# Patient Record
Sex: Male | Born: 1975
Health system: Southern US, Community
[De-identification: ages and names within clinical notes are randomized; demographics above are authoritative.]

## PROBLEM LIST (undated history)

## (undated) DIAGNOSIS — K2970 Gastritis, unspecified, without bleeding: Secondary | ICD-10-CM

## (undated) DIAGNOSIS — K429 Umbilical hernia without obstruction or gangrene: Secondary | ICD-10-CM

## (undated) DIAGNOSIS — F419 Anxiety disorder, unspecified: Secondary | ICD-10-CM

## (undated) DIAGNOSIS — K219 Gastro-esophageal reflux disease without esophagitis: Secondary | ICD-10-CM

## (undated) HISTORY — DX: Gastritis, unspecified, without bleeding: K29.70

## (undated) HISTORY — DX: Anxiety disorder, unspecified: F41.9

## (undated) HISTORY — DX: Umbilical hernia without obstruction or gangrene: K42.9

## (undated) HISTORY — DX: Gastro-esophageal reflux disease without esophagitis: K21.9

---

## 2007-06-17 ENCOUNTER — Encounter: Admission: RE | Admit: 2007-06-17 | Discharge: 2007-06-17 | Payer: Self-pay | Admitting: Family Medicine

## 2008-08-29 ENCOUNTER — Ambulatory Visit: Payer: Self-pay | Admitting: Sports Medicine

## 2008-08-29 DIAGNOSIS — M7650 Patellar tendinitis, unspecified knee: Secondary | ICD-10-CM

## 2008-08-29 DIAGNOSIS — M25569 Pain in unspecified knee: Secondary | ICD-10-CM

## 2009-06-08 ENCOUNTER — Encounter: Admission: RE | Admit: 2009-06-08 | Discharge: 2009-06-08 | Payer: Self-pay | Admitting: Family Medicine

## 2011-09-03 ENCOUNTER — Other Ambulatory Visit: Payer: Self-pay | Admitting: Family Medicine

## 2011-09-03 DIAGNOSIS — R109 Unspecified abdominal pain: Secondary | ICD-10-CM

## 2011-09-04 ENCOUNTER — Ambulatory Visit
Admission: RE | Admit: 2011-09-04 | Discharge: 2011-09-04 | Disposition: A | Discharge status: Home / Self Care | Payer: 59 | Source: Ambulatory Visit | Attending: Family Medicine | Admitting: Family Medicine

## 2011-09-04 DIAGNOSIS — R109 Unspecified abdominal pain: Secondary | ICD-10-CM

## 2013-07-03 ENCOUNTER — Encounter: Payer: Self-pay | Admitting: *Deleted

## 2015-07-28 DIAGNOSIS — M1610 Unilateral primary osteoarthritis, unspecified hip: Secondary | ICD-10-CM | POA: Insufficient documentation

## 2016-02-26 DIAGNOSIS — I517 Cardiomegaly: Secondary | ICD-10-CM | POA: Insufficient documentation

## 2016-03-27 HISTORY — PX: TOTAL HIP ARTHROPLASTY: SHX124

## 2016-04-09 DIAGNOSIS — Z96649 Presence of unspecified artificial hip joint: Secondary | ICD-10-CM | POA: Insufficient documentation

## 2016-04-30 DIAGNOSIS — M1611 Unilateral primary osteoarthritis, right hip: Secondary | ICD-10-CM | POA: Diagnosis not present

## 2016-04-30 DIAGNOSIS — R262 Difficulty in walking, not elsewhere classified: Secondary | ICD-10-CM | POA: Diagnosis not present

## 2016-04-30 DIAGNOSIS — M25551 Pain in right hip: Secondary | ICD-10-CM | POA: Diagnosis not present

## 2016-05-08 DIAGNOSIS — Z96641 Presence of right artificial hip joint: Secondary | ICD-10-CM | POA: Diagnosis not present

## 2016-05-08 DIAGNOSIS — M160 Bilateral primary osteoarthritis of hip: Secondary | ICD-10-CM | POA: Diagnosis not present

## 2016-10-22 DIAGNOSIS — R079 Chest pain, unspecified: Secondary | ICD-10-CM | POA: Diagnosis not present

## 2016-10-22 DIAGNOSIS — R1011 Right upper quadrant pain: Secondary | ICD-10-CM | POA: Diagnosis not present

## 2016-10-22 DIAGNOSIS — R160 Hepatomegaly, not elsewhere classified: Secondary | ICD-10-CM | POA: Diagnosis not present

## 2016-10-22 DIAGNOSIS — R1013 Epigastric pain: Secondary | ICD-10-CM | POA: Diagnosis not present

## 2016-10-23 ENCOUNTER — Encounter: Payer: Self-pay | Admitting: Nurse Practitioner

## 2016-10-27 HISTORY — PX: ESOPHAGOGASTRODUODENOSCOPY: SHX1529

## 2016-10-29 ENCOUNTER — Encounter: Payer: Self-pay | Admitting: Nurse Practitioner

## 2016-10-29 ENCOUNTER — Ambulatory Visit (INDEPENDENT_AMBULATORY_CARE_PROVIDER_SITE_OTHER): Payer: 59 | Admitting: Nurse Practitioner

## 2016-10-29 ENCOUNTER — Encounter (INDEPENDENT_AMBULATORY_CARE_PROVIDER_SITE_OTHER): Payer: Self-pay

## 2016-10-29 VITALS — BP 120/80 | HR 72 | Ht 74.0 in | Wt 216.4 lb

## 2016-10-29 DIAGNOSIS — R1011 Right upper quadrant pain: Secondary | ICD-10-CM

## 2016-10-29 NOTE — Patient Instructions (Addendum)
If you are age 39 or older, your body mass index should be between 23-30. Your Body mass index is 27.78 kg/m. If this is out of the aforementioned range listed, please consider follow up with your Primary Care Provider.  If you are age 24 or younger, your body mass index should be between 19-25. Your Body mass index is 27.78 kg/m. If this is out of the aformentioned range listed, please consider follow up with your Primary Care Provider.   You have been scheduled for an endoscopy. Please follow written instructions given to you at your visit today. If you use inhalers (even only as needed), please bring them with you on the day of your procedure. Your physician has requested that you go to www.startemmi.com and enter the access code given to you at your visit today. This web site gives a general overview about your procedure. However, you should still follow specific instructions given to you by our office regarding your preparation for the procedure.   Thank you for choosing me and [facility].   Tye Savoy, NP

## 2016-10-29 NOTE — Progress Notes (Addendum)
HPI:  Patient is a 41 yo male [place] with a PMH significant for osteoarthritis of hip, s/p replacement in November. He is new to the practice, here for evaluation of abdominal discomfort. Patient gives a year or so history of intermittent RUQ discomfort described as a burning sensation, non-radiating. Discomfort most often, but not always postprandial and worse with caffeine. Additionally, he doesn't consume much ETOH but has noticed a correlation between ETOH and the discomfort.  He was taking NSIADs on regular basis prior to hip surgery in November now rarely takes them. Concerned about gallbladder disease patient began taking apple cider months ago. He thinks the cider helped but it was hard to know for sure because his diet was poor and he was consuming massive amounts of caffeine.  He has no associated nausea/vomiting. No major bowel changes. No blood in stool. No major weight loss.  Over the last couple of months the discomfort has been more pronounced leading to anxiety. He is hesitant to call the RUQ discomfort pain but just "feels off" and uncomfortable after eating. He had an episode of the discomfort on the way to [facility] 6/26. Discomfort associated with flushing and feeling out of sorts so he went to ED in [facility]. In retrospect patient thinks the abdominal discomfort led to anxiety. Labs from ED reviewed.  WBC normal at 47, hemoglobin normal at 14.1, platelets low at 144 . Renal function was normal. Liver labs and lipase were normal. Urinalysis normal. Patient apparently had an ultrasound, there was some question about whether the gallbladder could be adequately evaluated but overall no gross abnormalities. Patient was prescribed Reglan, Carafate and 40 mg of omeprazole daily. He is not taking the Reglan, it made him feel jittery. Also jittery from Omeprazole so decreased dose to 20mg  and feels better. Since making dietary changes (omitting ETOH, soda, and caffeine) and taking meds  he is feeling better.      Past Medical History:  Diagnosis Date  . Umbilical hernia     Past Surgical History:  Procedure Laterality Date  . TOTAL HIP ARTHROPLASTY  03/27/2016   Family History  Problem Relation Age of Onset  . Colon cancer Paternal Grandfather    Social History  Substance Use Topics  . Smoking status: Never Smoker  . Smokeless tobacco: Never Used  . Alcohol use No   Current Outpatient Prescriptions  Medication Sig Dispense Refill  . Multiple Vitamin (MULTIVITAMIN) tablet Take 1 tablet by mouth daily.    . Omega-3 Fatty Acids (FISH OIL PO) Take by mouth.    Marland Kitchen omeprazole (PRILOSEC) 20 MG capsule Take 20 mg by mouth daily.    . sucralfate (CARAFATE) 1 g tablet Take 1 g by mouth 2 (two) times daily.     No current facility-administered medications for this visit.    No Known Allergies   Review of Systems:  Positive for allergy, sinus trouble, and back pain. All systems reviewed and negative except where noted in HPI.   Physical Exam: BP 120/80   Pulse 72   Ht 6\' 2"  (1.88 m)   Wt 216 lb 6 oz (98.1 kg)   BMI 27.78 kg/m  Constitutional:  Well-developed, white male in no acute distress. Psychiatric: Normal mood and affect. Behavior is normal. EENT: Pupils normal.  Conjunctivae are normal. No scleral icterus. Neck supple.  Cardiovascular: Normal rate, regular rhythm. No edema Pulmonary/chest: Effort normal and breath sounds normal. No wheezing, rales or rhonchi. Abdominal: Soft, nondistended. Nontender.  Bowel sounds active throughout. There are no masses palpable. No hepatomegaly. Lymphadenopathy: No cervical adenopathy noted. Neurological: Alert and oriented to person place and time. Skin: Skin is warm and dry. No rashes noted.   ASSESSMENT AND PLAN:  41 yo male with one year history of RUQ discomfort, progressive over last couple of months. Discomfort mainly postprandial but also associated with caffeine and ETOH intake. No significant ETOH use  but he has been consuming huge amount of caffeine until recently. Some improvement in discomfort with carafate, Prilosec and dietary changes. Recent labs at ED in [place] were unremarkable.  -We discussed options such as a watch and watch approach to see how he does from here vrs proceeding with upper endoscopy.  Patient and wife would like to proceed with EGD. The risks and benefits of EGD were discussed and the patient agrees to proceed.  -Requesting ultrasound report from [facility] in [place]. Patient seems to think the gallbladder wasn't visualized because of gas in bowel but I suspect it may have been the pancreas instead.  -Continue PPI. He thinks Carafate is helping and wants to continue that for now as well.  -further recommendations to follow EGD.     Tye Savoy, NP  10/29/2016, 1:22 PM  Agree with Ms. Lilian Fuhs's assessment and plan. Gatha Mayer, MD, [facility]

## 2016-11-01 ENCOUNTER — Encounter: Payer: Self-pay | Admitting: Family Medicine

## 2016-11-01 ENCOUNTER — Telehealth: Payer: Self-pay | Admitting: Internal Medicine

## 2016-11-01 ENCOUNTER — Ambulatory Visit (INDEPENDENT_AMBULATORY_CARE_PROVIDER_SITE_OTHER): Payer: 59 | Admitting: Family Medicine

## 2016-11-01 ENCOUNTER — Encounter: Payer: Self-pay | Admitting: Internal Medicine

## 2016-11-01 VITALS — BP 136/86 | HR 65 | Temp 98.2°F | Ht 74.0 in | Wt 214.6 lb

## 2016-11-01 DIAGNOSIS — J309 Allergic rhinitis, unspecified: Secondary | ICD-10-CM | POA: Diagnosis not present

## 2016-11-01 DIAGNOSIS — Z13220 Encounter for screening for lipoid disorders: Secondary | ICD-10-CM | POA: Diagnosis not present

## 2016-11-01 DIAGNOSIS — Z6827 Body mass index (BMI) 27.0-27.9, adult: Secondary | ICD-10-CM

## 2016-11-01 DIAGNOSIS — F419 Anxiety disorder, unspecified: Secondary | ICD-10-CM

## 2016-11-01 DIAGNOSIS — R5383 Other fatigue: Secondary | ICD-10-CM

## 2016-11-01 DIAGNOSIS — Z7689 Persons encountering health services in other specified circumstances: Secondary | ICD-10-CM | POA: Diagnosis not present

## 2016-11-01 MED ORDER — FLUTICASONE PROPIONATE 50 MCG/ACT NA SUSP: 2.0000 | Freq: Every day | NASAL | 6 refills | Status: AC

## 2016-11-01 NOTE — Patient Instructions (Signed)
Use Afrin (generic is fine) nasal spray 15 minutes before using Flonse Try to use flonase for at least 3-4 weeks

## 2016-11-01 NOTE — Telephone Encounter (Signed)
Patient informed that we did get it and the report is on Paula's desk for review and we will be back in touch.

## 2016-11-01 NOTE — Progress Notes (Signed)
Subjective:    Patient ID: Matthew Hansen, male    DOB: Sep 10, 1975, 41 y.o.   MRN: [id number]  HPI This is a 41 yo male who presents today to establish care. 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 He is accompanied by his wife. He also has his own landscaping business.  He was seen by GI three days ago with chronic RUQ discomfort (approximately 1 year). Had recently been seen in ED [place] way [place] [facility] with abdominal pain/anxiety. Was given reglan, omeprazole and carafate. Stopped reglan and reduced omeprazole due to increased anxiety. Has stopped carafate, thinks it made him feel full and decreased his appetite. Appetite improving with omeprazole. Stomach is feeling better, he has appointment for EGD later this month.  Fatigue-  Had been taking prohormones ("to suppress my natural testosterone"), stopped taking them about 2.5-3 weeks ago and feels off. Started taking them in November. Was supposed to take a different supplement when he stopped the prohormones to "naturally build up my testosterone," but didn't because of concerns over his stomach issues.   Anxiety/panic attacks- Has had increased anxiety, started after abdominal issues, for about 1 month. Feels up and down with emotions, feels fatigued. Sleeping ok. Episodes feel like "butterflies in my stomach and like I need to go to the bathroom." Lasts for about an hour, will go away with relaxing, talking to his wife. Increased stress lately with work. 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   Allergic rhinitis/sinus- Gets frequent sinus infections. Was recently on amoxicillin and prednisone. Has seasonal allergies in spring/summer. Has tried flonase in past, but did not use for very long. Doesn't tolerate decongestants, "make me feel funny."   Past Medical History:  Diagnosis Date  . Umbilical hernia    Past Surgical  History:  Procedure Laterality Date  . TOTAL HIP ARTHROPLASTY  03/27/2016   Family History  Problem Relation Age of Onset  . Colon cancer Paternal Grandfather    Social History  Substance Use Topics  . Smoking status: Never Smoker  . Smokeless tobacco: Never Used  . Alcohol use Yes     Comment: 2-3 per week      Review of Systems  Constitutional: Positive for fatigue. Negative for fever and unexpected weight change.  HENT: Positive for ear pain (left), sinus pressure and sore throat.   Eyes: Negative.   Respiratory: Negative.   Cardiovascular: Positive for palpitations (during anxiety). Negative for chest pain and leg swelling.  Gastrointestinal: Positive for abdominal pain. Negative for anal bleeding, blood in stool, constipation, diarrhea and nausea.  Endocrine: Negative.   Genitourinary: Negative for difficulty urinating, dysuria and flank pain.  Musculoskeletal: Negative.   Skin: Negative.   Allergic/Immunologic: Negative for environmental allergies.  Psychiatric/Behavioral: The patient is nervous/anxious.        Objective:   Physical Exam  Constitutional: He is oriented to person, place, and time. He appears well-developed and well-nourished. No distress.  HENT:  Head: Normocephalic and atraumatic.  Right Ear: Tympanic membrane, external ear and ear canal normal. No mastoid tenderness.  Left Ear: External ear and ear canal normal. No mastoid tenderness. Tympanic membrane is not injected, not erythematous, not retracted and not bulging.  Ears:  Nose: Nose normal.  Mouth/Throat: Oropharynx is clear and moist. No oropharyngeal exudate.  Eyes: Conjunctivae are normal.  Neck:  Normal range of motion. Neck supple.  Cardiovascular: Normal rate, regular rhythm and normal heart sounds.   Pulmonary/Chest: Effort normal and breath sounds normal.  Musculoskeletal: He exhibits no edema.  Lymphadenopathy:    He has no cervical adenopathy.  Neurological: He is alert and  oriented to person, place, and time.  Skin: Skin is warm and dry. He is not diaphoretic.  Psychiatric: He has a normal mood and affect. His behavior is normal. Judgment and thought content normal.  Vitals reviewed.     BP 136/86 (BP Location: Right Arm, Patient Position: Sitting, Cuff Size: Normal)   Pulse 65   Temp 98.2 F (36.8 C) (Oral)   Ht 6\' 2"  (1.88 m)   Wt 214 lb 9.6 oz (97.3 kg)   SpO2 96%   BMI 27.55 kg/m  Wt Readings from Last 3 Encounters:  11/01/16 214 lb 9.6 oz (97.3 kg)  10/29/16 216 lb 6 oz (98.1 kg)  08/29/08 (!) 225 lb (102.1 kg)   Depression screen PHQ 2/9 11/01/2016  Decreased Interest 2  Down, Depressed, Hopeless 2  PHQ - 2 Score 4  Altered sleeping 0  Tired, decreased energy 2  Change in appetite 1  Feeling bad or failure about yourself  0  Trouble concentrating 2  Moving slowly or fidgety/restless 0  Suicidal thoughts 0  PHQ-9 Score 9      Assessment & Plan:  1. Encounter to establish care - Discussed and encouraged healthy lifestyle choices- adequate sleep, regular exercise, stress management and healthy food choices.   2. Anxiety - seems to be exacerbated by work stressors and recent abdominal discomfort. He is keeping a log of episodes and I encouraged him to continue this to see if episode frequency and duration decrease as abdominal symptoms improve - he is getting through episodes with breathing and relaxation and with help from his wife - follow up if not significantly improved in next couple of weeks - not sure of role of recent prednisone use and prohormone usage  3. Allergic rhinitis, unspecified seasonality, unspecified trigger - Afrin before flonase for 4 days max - discussed flonase use and encouraged him to use for several weeks - fluticasone (FLONASE) 50 MCG/ACT nasal spray; Place 2 sprays into both nostrils daily.  Dispense: 16 g; Refill: 6  4. Fatigue, unspecified type - not sure if related to stopping prohormones - TSH;  Future - Testosterone; Future - CBC; Future  5. BMI 27.0-27.9,adult - Lipid panel; Future  6. Screening for lipid disorders - Lipid panel; Future - with recent prohormone use, will check lipids    Clarene Reamer, FNP-BC  [facility] [place], [place]  11/01/2016 9:13 AM

## 2016-11-04 ENCOUNTER — Telehealth: Payer: Self-pay | Admitting: Internal Medicine

## 2016-11-04 ENCOUNTER — Other Ambulatory Visit: Payer: Self-pay

## 2016-11-04 ENCOUNTER — Other Ambulatory Visit (INDEPENDENT_AMBULATORY_CARE_PROVIDER_SITE_OTHER): Payer: 59

## 2016-11-04 DIAGNOSIS — R1011 Right upper quadrant pain: Secondary | ICD-10-CM

## 2016-11-04 DIAGNOSIS — Z13220 Encounter for screening for lipoid disorders: Secondary | ICD-10-CM

## 2016-11-04 DIAGNOSIS — Z6827 Body mass index (BMI) 27.0-27.9, adult: Secondary | ICD-10-CM

## 2016-11-04 DIAGNOSIS — R5383 Other fatigue: Secondary | ICD-10-CM | POA: Diagnosis not present

## 2016-11-04 DIAGNOSIS — G8929 Other chronic pain: Secondary | ICD-10-CM

## 2016-11-04 DIAGNOSIS — R110 Nausea: Secondary | ICD-10-CM

## 2016-11-04 LAB — LIPID PANEL
Cholesterol: 121 mg/dL (ref 0–200)
HDL: 36.6 mg/dL — AB (ref >39.00)
LDL Cholesterol: 67 mg/dL (ref 0–99)
NONHDL: 84.57
TRIGLYCERIDES: 87 mg/dL (ref 0.0–149.0)
Total CHOL/HDL Ratio: 3
VLDL: 17.4 mg/dL (ref 0.0–40.0)

## 2016-11-04 LAB — CBC
HCT: 42.6 % (ref 39.0–52.0)
HEMOGLOBIN: 14.3 g/dL (ref 13.0–17.0)
MCHC: 33.6 g/dL (ref 30.0–36.0)
MCV: 87.8 fl (ref 78.0–100.0)
Platelets: 135 10*3/uL — ABNORMAL LOW (ref 150.0–400.0)
RBC: 4.86 Mil/uL (ref 4.22–5.81)
RDW: 13.3 % (ref 11.5–15.5)
WBC: 4.2 10*3/uL (ref 4.0–10.5)

## 2016-11-04 LAB — TSH: TSH: 0.54 u[IU]/mL (ref 0.35–4.50)

## 2016-11-04 LAB — TESTOSTERONE: TESTOSTERONE: 367.14 ng/dL (ref 300.00–890.00)

## 2016-11-04 NOTE — Telephone Encounter (Signed)
Moved to [facility]. Patient is aware. States he can see the appointment on his My Chart account.

## 2016-11-04 NOTE — Telephone Encounter (Signed)
Order sent to [place] for abd u/s. Patient is aware he will be contacted to schedule. He hopes to schedule asap.

## 2016-11-04 NOTE — Telephone Encounter (Signed)
OK - Let him know I had reviewed notes from [place] - he had an [place] in [place] but not clear it was technically adequate.  Reasonable to do abdominal ultrasound - limited RUQ - re: RUQ pain  Please order and let him know we will contact him with results

## 2016-11-04 NOTE — Telephone Encounter (Signed)
Patient continues to have RUQ pain despite medication. If he eats a fatty meal, he has intense RUQ pain into his back. He feels very strongly his gall bladder is sick. He wants to investigate this further before having the EGD. His EGD is scheduled 11/11/16.

## 2016-11-05 ENCOUNTER — Encounter: Payer: Self-pay | Admitting: Family Medicine

## 2016-11-05 NOTE — Telephone Encounter (Signed)
Patient informed but had some questions. Dr Carlean Purl said he will call him tomorrow.

## 2016-11-05 NOTE — Telephone Encounter (Signed)
I did get the ultrasound. Please let patient know that it was in fact the pancreas that was not visualized (common) on ultrasound. His gallbladder looked okay. Thanks

## 2016-11-06 ENCOUNTER — Ambulatory Visit (HOSPITAL_COMMUNITY)
Admission: RE | Admit: 2016-11-06 | Discharge: 2016-11-06 | Disposition: A | Discharge status: Home / Self Care | Payer: 59 | Source: Ambulatory Visit | Attending: Nurse Practitioner | Admitting: Nurse Practitioner

## 2016-11-06 DIAGNOSIS — R1011 Right upper quadrant pain: Secondary | ICD-10-CM | POA: Diagnosis not present

## 2016-11-06 DIAGNOSIS — R110 Nausea: Secondary | ICD-10-CM | POA: Diagnosis not present

## 2016-11-06 NOTE — Telephone Encounter (Signed)
I reviewed [place] with him. Will see him at EGD Monday

## 2016-11-07 ENCOUNTER — Telehealth: Payer: Self-pay | Admitting: Family Medicine

## 2016-11-07 ENCOUNTER — Telehealth: Payer: Self-pay | Admitting: Internal Medicine

## 2016-11-07 NOTE — Telephone Encounter (Signed)
Please call him and tell him I am happy to put in a referral to [facility]. Please advise him it can take several weeks to get an appointment.

## 2016-11-07 NOTE — Telephone Encounter (Signed)
Patient notified that unable to perform a HIDA scan, will need to keep his appt for Monday for EGD.

## 2016-11-07 NOTE — Telephone Encounter (Signed)
Patient calling to check on status of anxiety meds.  Thank you,  -LL

## 2016-11-07 NOTE — Telephone Encounter (Signed)
Pt thinks gallbladder bot GI doc has not dx of that. Pt wants second opinion consult at [facility]. Call pt he is scheduled endoscopy Monday which he doesn't want to pay if possible.

## 2016-11-07 NOTE — Telephone Encounter (Signed)
I spoke to him twice and documented in a phone note - not sure where it went - we were having CHL problems yesterday.  I reviewed negative [place] and he has EGD for 7/16 next

## 2016-11-07 NOTE — Telephone Encounter (Signed)
Per [place] report/Paula Guenther RNP you spoke with this patient yesterday, I do not see any documentation.  Were there any additional plans for his worsening pain?

## 2016-11-07 NOTE — Telephone Encounter (Signed)
Pt req anxiety med to get him through next week while dealing with medical issues and trying to find diagnosis with upcoming diagnostic testing. [facility] [address]. Call pt [phone].

## 2016-11-07 NOTE — Telephone Encounter (Signed)
Please Advise

## 2016-11-07 NOTE — Telephone Encounter (Signed)
Patient notified to keep his appt on Monday.  He is going to restart his Prilosec that he stopped a few days ago.  I will call him if an opening is available tomorrow.

## 2016-11-08 ENCOUNTER — Telehealth: Payer: Self-pay | Admitting: Internal Medicine

## 2016-11-08 ENCOUNTER — Encounter: Payer: Self-pay | Admitting: Family Medicine

## 2016-11-08 ENCOUNTER — Ambulatory Visit (INDEPENDENT_AMBULATORY_CARE_PROVIDER_SITE_OTHER): Payer: 59 | Admitting: Family Medicine

## 2016-11-08 VITALS — BP 128/82 | HR 74 | Temp 98.3°F | Wt 211.0 lb

## 2016-11-08 DIAGNOSIS — G8929 Other chronic pain: Secondary | ICD-10-CM | POA: Diagnosis not present

## 2016-11-08 DIAGNOSIS — F418 Other specified anxiety disorders: Secondary | ICD-10-CM

## 2016-11-08 DIAGNOSIS — R1011 Right upper quadrant pain: Secondary | ICD-10-CM | POA: Diagnosis not present

## 2016-11-08 MED ORDER — SUCRALFATE 1 G PO TABS: 1.0000 g | ORAL_TABLET | Freq: Three times a day (TID) | ORAL | 0 refills | Status: DC

## 2016-11-08 MED ORDER — ESCITALOPRAM OXALATE 10 MG PO TABS: 10.0000 mg | ORAL_TABLET | Freq: Every day | ORAL | 3 refills | Status: DC

## 2016-11-08 MED ORDER — CLONAZEPAM 0.5 MG PO TABS: 0.5000 mg | ORAL_TABLET | Freq: Two times a day (BID) | ORAL | 0 refills | Status: DC | PRN

## 2016-11-08 NOTE — Telephone Encounter (Signed)
Sure 1 g tid ac and hs # 120 - ask him if he wants liquid or pills and fill accordingly  No refill

## 2016-11-08 NOTE — Patient Instructions (Signed)
I have given you two prescriptions-  Clonazepam is a benzodiazepine that is potentially habit forming. It is important to use sparingly.   escitalopram is a selective seratonin uptake inhibiter- it is used for anxiety and depression.   Please follow up in 1 month  Escitalopram tablets What is this medicine? ESCITALOPRAM (es sye TAL oh pram) is used to treat depression and certain types of anxiety. This medicine may be used for other purposes; ask your health care provider or pharmacist if you have questions. COMMON BRAND NAME(S): Lexapro What should I tell my health care provider before I take this medicine? They need to know if you have any of these conditions: -bipolar disorder or a family history of bipolar disorder -diabetes -glaucoma -heart disease -kidney or liver disease -receiving electroconvulsive therapy -seizures (convulsions) -suicidal thoughts, plans, or attempt by you or a family member -an unusual or allergic reaction to escitalopram, the related drug citalopram, other medicines, foods, dyes, or preservatives -pregnant or trying to become pregnant -breast-feeding How should I use this medicine? Take this medicine by mouth with a glass of water. Follow the directions on the prescription label. You can take it with or without food. If it upsets your stomach, take it with food. Take your medicine at regular intervals. Do not take it more often than directed. Do not stop taking this medicine suddenly except upon the advice of your doctor. Stopping this medicine too quickly may cause serious side effects or your condition may worsen. A special MedGuide will be given to you by the pharmacist with each prescription and refill. Be sure to read this information carefully each time. Talk to your pediatrician regarding the use of this medicine in children. Special care may be needed. Overdosage: If you think you have taken too much of this medicine contact a poison control center or  emergency room at once. NOTE: This medicine is only for you. Do not share this medicine with others. What if I miss a dose? If you miss a dose, take it as soon as you can. If it is almost time for your next dose, take only that dose. Do not take double or extra doses. What may interact with this medicine? Do not take this medicine with any of the following medications: -certain medicines for fungal infections like fluconazole, itraconazole, ketoconazole, posaconazole, voriconazole -cisapride -citalopram -dofetilide -dronedarone -linezolid -MAOIs like Carbex, Eldepryl, Marplan, Nardil, and Parnate -methylene blue (injected into a vein) -pimozide -thioridazine -ziprasidone This medicine may also interact with the following medications: -alcohol -amphetamines -aspirin and aspirin-like medicines -carbamazepine -certain medicines for depression, anxiety, or psychotic disturbances -certain medicines for migraine headache like almotriptan, eletriptan, frovatriptan, naratriptan, rizatriptan, sumatriptan, zolmitriptan -certain medicines for sleep -certain medicines that treat or prevent blood clots like warfarin, enoxaparin, dalteparin -cimetidine -diuretics -fentanyl -furazolidone -isoniazid -lithium -metoprolol -NSAIDs, medicines for pain and inflammation, like ibuprofen or naproxen -other medicines that prolong the QT interval (cause an abnormal heart rhythm) -procarbazine -rasagiline -supplements like St. John's wort, kava kava, valerian -tramadol -tryptophan This list may not describe all possible interactions. Give your health care provider a list of all the medicines, herbs, non-prescription drugs, or dietary supplements you use. Also tell them if you smoke, drink alcohol, or use illegal drugs. Some items may interact with your medicine. What should I watch for while using this medicine? Tell your doctor if your symptoms do not get better or if they get worse. Visit your  doctor or health care professional for regular checks on your  progress. Because it may take several weeks to see the full effects of this medicine, it is important to continue your treatment as prescribed by your doctor. Patients and their families should watch out for new or worsening thoughts of suicide or depression. Also watch out for sudden changes in feelings such as feeling anxious, agitated, panicky, irritable, hostile, aggressive, impulsive, severely restless, overly excited and hyperactive, or not being able to sleep. If this happens, especially at the beginning of treatment or after a change in dose, call your health care professional. Dennis Shostak may get drowsy or dizzy. Do not drive, use machinery, or do anything that needs mental alertness until you know how this medicine affects you. Do not stand or sit up quickly, especially if you are an older patient. This reduces the risk of dizzy or fainting spells. Alcohol may interfere with the effect of this medicine. Avoid alcoholic drinks. Your mouth may get dry. Chewing sugarless gum or sucking hard candy, and drinking plenty of water may help. Contact your doctor if the problem does not go away or is severe. What side effects may I notice from receiving this medicine? Side effects that you should report to your doctor or health care professional as soon as possible: -allergic reactions like skin rash, itching or hives, swelling of the face, lips, or tongue -anxious -black, tarry stools -changes in vision -confusion -elevated mood, decreased need for sleep, racing thoughts, impulsive behavior -eye pain -fast, irregular heartbeat -feeling faint or lightheaded, falls -feeling agitated, angry, or irritable -hallucination, loss of contact with reality -loss of balance or coordination -loss of memory -painful or prolonged erections -restlessness, pacing, inability to keep still -seizures -stiff muscles -suicidal thoughts or other mood  changes -trouble sleeping -unusual bleeding or bruising -unusually weak or tired -vomiting Side effects that usually do not require medical attention (report to your doctor or health care professional if they continue or are bothersome): -changes in appetite -change in sex drive or performance -headache -increased sweating -indigestion, nausea -tremors This list may not describe all possible side effects. Call your doctor for medical advice about side effects. You may report side effects to FDA at 1-800-FDA-1088. Where should I keep my medicine? Keep out of reach of children. Store at room temperature between 15 and 30 degrees C (59 and 86 degrees F). Throw away any unused medicine after the expiration date. NOTE: This sheet is a summary. It may not cover all possible information. If you have questions about this medicine, talk to your doctor, pharmacist, or health care provider.  2018 Elsevier/Gold Standard (2015-09-18 13:20:23)

## 2016-11-08 NOTE — Progress Notes (Signed)
   Subjective:    Patient ID: Matthew Hansen, male    DOB: Jan 02, 1976, 41 y.o.   MRN: [id number]  HPI This is a 41 yo male who presents today with anxiety. He has been having abdominal pain and has had recent visit with me as well as GI. He has had two negative ultraounds and is scheduled for an EGD in 3 days. He is worried he has stomach cancer.   He was feeling better last week and stopped taking his omeprazole and carafate and was taking an occasional ranitidine. Has some increase in burning and RUQ pain yesterday and restarted his medication with some improvement this morning. He took one of his mother's alprazolam with good relief. Has also tried cammomile tea without relief.   He states he has a great deal of stress with having 2 small children, two jobs and wife in school. He says he always denies feelings of stress and anxiety when asked, but when he thinks about it, it does bother him nearly daily. His mother is an anxious person, she manages with Xanax.   Past Medical History:  Diagnosis Date  . Umbilical hernia    Past Surgical History:  Procedure Laterality Date  . TOTAL HIP ARTHROPLASTY  03/27/2016   Family History  Problem Relation Age of Onset  . Colon cancer Paternal Grandfather   . Hypertension Mother   . COPD Mother   . Hypertension Father    Social History  Substance Use Topics  . Smoking status: Never Smoker  . Smokeless tobacco: Current User    Types: Snuff  . Alcohol use Yes     Comment: 2-3 per week      Review of Systems Per HPI    Objective:   Physical Exam    BP (!) 150/88 (BP Location: Right Arm, Patient Position: Sitting, Cuff Size: Normal)   Pulse 74   Temp 98.3 F (36.8 C) (Oral)   Wt 211 lb (95.7 kg)   SpO2 97%   BMI 27.09 kg/m  Wt Readings from Last 3 Encounters:  11/08/16 211 lb (95.7 kg)  11/01/16 214 lb 9.6 oz (97.3 kg)  10/29/16 216 lb 6 oz (98.1 kg)   BP Readings from Last 3 Encounters:  11/08/16 (!) 150/88  11/01/16 136/86    10/29/16 120/80      Assessment & Plan:  1. Anxiety about health - discussed situation anxiety versus GAD, patient possibly interested in daily medication. Discussed starting SSRI and provided him a written prescription and discussed potential side effects. He will consider starting after EDG in 3 days.  - discussed non pharmacological ways to improve anxiety- regular exercise, good sleep habits - clonazePAM (KLONOPIN) 0.5 MG tablet; Take 1 tablet (0.5 mg total) by mouth 2 (two) times daily as needed for anxiety.  Dispense: 30 tablet; Refill: 0- discussed potential for dependence and tolerance.  - escitalopram (LEXAPRO) 10 MG tablet; Take 1 tablet (10 mg total) by mouth at bedtime.  Dispense: 30 tablet; Refill: 3 - follow up in 1 months, sooner if needed  2. Chronic RUQ pain - reassuring ultrasound, discussed findings with patient - encouraged him to continue PPI - has EGD scheduled, will await results  Over 30 minutes were spent face-to-face with the patient during this encounter and >50% of that time was spent on counseling and coordination of care  Clarene Reamer, FNP-BC  [facility] [place], [place]  11/08/2016 10:55 AM

## 2016-11-08 NOTE — Progress Notes (Signed)
   Subjective:    Patient ID: Matthew Hansen, male    DOB: 1975-09-29, 41 y.o.   MRN: [id number]  HPI    Review of Systems     Objective:   Physical Exam        Assessment & Plan:

## 2016-11-08 NOTE — Telephone Encounter (Signed)
Spoke with Matthew Hansen and he said the pills have worked okay so refilled them , confirmed pharmacy.

## 2016-11-08 NOTE — Telephone Encounter (Signed)
Spoke with patient he states that he would like the referral placed but he may not need it. He states that he has an appointment Monday and may need further surgery.

## 2016-11-08 NOTE — Telephone Encounter (Signed)
Called and spoke with pt informing him that a office visit was needed per PCP. Appointment scheduled for today at 9:15am. Nothing further needed at this time.

## 2016-11-08 NOTE — Telephone Encounter (Signed)
Please advise Sir? 

## 2016-11-11 ENCOUNTER — Other Ambulatory Visit: Payer: 59

## 2016-11-11 ENCOUNTER — Ambulatory Visit (AMBULATORY_SURGERY_CENTER): Payer: 59 | Admitting: Internal Medicine

## 2016-11-11 ENCOUNTER — Encounter: Payer: Self-pay | Admitting: Internal Medicine

## 2016-11-11 VITALS — BP 127/65 | HR 61 | Temp 98.0°F | Resp 18 | Ht 74.0 in | Wt 216.0 lb

## 2016-11-11 DIAGNOSIS — K2100 Gastro-esophageal reflux disease with esophagitis, without bleeding: Secondary | ICD-10-CM

## 2016-11-11 DIAGNOSIS — R1011 Right upper quadrant pain: Secondary | ICD-10-CM

## 2016-11-11 MED ORDER — SODIUM CHLORIDE 0.9 % IV SOLN: 500.0000 mL | INTRAVENOUS | Status: DC

## 2016-11-11 MED ORDER — PANTOPRAZOLE SODIUM 40 MG PO TBEC: 40.0000 mg | DELAYED_RELEASE_TABLET | Freq: Every day | ORAL | 3 refills | Status: DC

## 2016-11-11 NOTE — Op Note (Signed)
[facility] Patient Name: Matthew Hansen Procedure Date: 11/11/2016 9:31 AM MRN: [id number] Endoscopist: Gatha Mayer , MD Age: 41 Referring MD:  Date of Birth: 1975/05/05 Gender: Male Account #: [id number] Procedure:                Upper GI endoscopy Indications:              Abdominal pain in the right upper quadrant burning Medicines:                Propofol total dose 400 mg IV, Monitored Anesthesia                            Care Procedure:                Pre-Anesthesia Assessment:                           - Prior to the procedure, a History and Physical                            was performed, and patient medications and                            allergies were reviewed. The patient's tolerance of                            previous anesthesia was also reviewed. The risks                            and benefits of the procedure and the sedation                            options and risks were discussed with the patient.                            All questions were answered, and informed consent                            was obtained. Prior Anticoagulants: The patient has                            taken no previous anticoagulant or antiplatelet                            agents. ASA Grade Assessment: II - A patient with                            mild systemic disease. After reviewing the risks                            and benefits, the patient was deemed in                            satisfactory condition to undergo the procedure.  After obtaining informed consent, the endoscope was                            passed under direct vision. Throughout the                            procedure, the patient's blood pressure, pulse, and                            oxygen saturations were monitored continuously. The                            Endoscope was introduced through the mouth, and                            advanced to the second part  of duodenum. The upper                            GI endoscopy was accomplished without difficulty.                            The patient tolerated the procedure well. Scope In: Scope Out: Findings:                 LA Grade A (one or more mucosal breaks less than 5                            mm, not extending between tops of 2 mucosal folds)                            esophagitis with no bleeding was found at the                            gastroesophageal junction.                           The exam was otherwise without abnormality.                           The cardia and gastric fundus were normal on                            retroflexion. Complications:            No immediate complications. Estimated Blood Loss:     Estimated blood loss: none. Impression:               - LA Grade A reflux esophagitis.                           - The examination was otherwise normal.                           - No specimens collected. Recommendation:           - Patient has a contact number available for  emergencies. The signs and symptoms of potential                            delayed complications were discussed with the                            patient. Return to normal activities tomorrow.                            Written discharge instructions were provided to the                            patient.                           - GERD indefinitely.                           - Continue present medications.                           - Follow an antireflux regimen.                           - Stop nexium 20 mg and start pantoprazole 40 mg                            daily before breakfast Gatha Mayer, MD 11/11/2016 9:57:56 AM This report has been signed electronically.

## 2016-11-11 NOTE — Progress Notes (Signed)
A and O x3. Report to RN. Tolerated MAC anesthesia well.Teeth unchanged after procedure.

## 2016-11-11 NOTE — Progress Notes (Signed)
Pt's states no medical or surgical changes since previsit or office visit. 

## 2016-11-11 NOTE — Patient Instructions (Addendum)
There is just a tiny area of inflammation where the esophagus and stomach meet. I think you have GERD - gastroesophageal reflux.  I am changing 20 mh nexium to 40 mg pantoprazole  - take it before a meal.  Use the carafate also.  Limit caffeine - follow GERD diet.  Make an appointment to see me - call now for a follow-up to be seen in September.  I appreciate the opportunity to care for you. Gatha Mayer, MD, FACG YOU HAD AN ENDOSCOPIC PROCEDURE TODAY AT [facility]:   Refer to the procedure report that was given to you for any specific questions about what was found during the examination.  If the procedure report does not answer your questions, please call your gastroenterologist to clarify.  If you requested that your care partner not be given the details of your procedure findings, then the procedure report has been included in a sealed envelope for you to review at your convenience later.  YOU SHOULD EXPECT: Some feelings of bloating in the abdomen. Passage of more gas than usual.  Walking can help get rid of the air that was put into your GI tract during the procedure and reduce the bloating. If you had a lower endoscopy (such as a colonoscopy or flexible sigmoidoscopy) you may notice spotting of blood in your stool or on the toilet paper. If you underwent a bowel prep for your procedure, you may not have a normal bowel movement for a few days.  Please Note:  You might notice some irritation and congestion in your nose or some drainage.  This is from the oxygen used during your procedure.  There is no need for concern and it should clear up in a day or so.  SYMPTOMS TO REPORT IMMEDIATELY:   Following lower endoscopy (colonoscopy or flexible sigmoidoscopy):  Excessive amounts of blood in the stool  Significant tenderness or worsening of abdominal pains  Swelling of the abdomen that is new, acute  Fever of 100F or higher   Following upper endoscopy  (EGD)  Vomiting of blood or coffee ground material  New chest pain or pain under the shoulder blades  Painful or persistently difficult swallowing  New shortness of breath  Fever of 100F or higher  Black, tarry-looking stools  For urgent or emergent issues, a gastroenterologist can be reached at any hour by calling [phone].   DIET:  We do recommend a small meal at first, but then you may proceed to your regular diet.  Drink plenty of fluids but you should avoid alcoholic beverages for 24 hours.  ACTIVITY:  You should plan to take it easy for the rest of today and you should NOT DRIVE or use heavy machinery until tomorrow (because of the sedation medicines used during the test).    FOLLOW UP: Our staff will call the number listed on your records the next business day following your procedure to check on you and address any questions or concerns that you may have regarding the information given to you following your procedure. If we do not reach you, we will leave a message.  However, if you are feeling well and you are not experiencing any problems, there is no need to return our call.  We will assume that you have returned to your regular daily activities without incident.  If any biopsies were taken you will be contacted by phone or by letter within the next 1-3 weeks.  Please call us at [phone] if you have not heard about the biopsies in 3 weeks.    SIGNATURES/CONFIDENTIALITY: You and/or your care partner have signed paperwork which will be entered into your electronic medical record.  These signatures attest to the fact that that the information above on your After Visit Summary has been reviewed and is understood.  Full responsibility of the confidentiality of this discharge information lies with you and/or your care-partner.YOU HAD AN ENDOSCOPIC PROCEDURE TODAY AT [facility]:   Refer to the procedure report that was given to you for any specific questions  about what was found during the examination.  If the procedure report does not answer your questions, please call your gastroenterologist to clarify.  If you requested that your care partner not be given the details of your procedure findings, then the procedure report has been included in a sealed envelope for you to review at your convenience later.  YOU SHOULD EXPECT: Some feelings of bloating in the abdomen. Passage of more gas than usual.  Walking can help get rid of the air that was put into your GI tract during the procedure and reduce the bloating. If you had a lower endoscopy (such as a colonoscopy or flexible sigmoidoscopy) you may notice spotting of blood in your stool or on the toilet paper. If you underwent a bowel prep for your procedure, you may not have a normal bowel movement for a few days.  Please Note:  You might notice some irritation and congestion in your nose or some drainage.  This is from the oxygen used during your procedure.  There is no need for concern and it should clear up in a day or so.  SYMPTOMS TO REPORT IMMEDIATELY:   Following upper endoscopy (EGD)  Vomiting of blood or coffee ground material  New chest pain or pain under the shoulder blades  Painful or persistently difficult swallowing  New shortness of breath  Fever of 100F or higher  Black, tarry-looking stools  For urgent or emergent issues, a gastroenterologist can be reached at any hour by calling [phone].   DIET:  We do recommend a small meal at first, but then you may proceed to your regular diet.  Drink plenty of fluids but you should avoid alcoholic beverages for 24 hours.  MEDICATIONS: Continue present medications. Stop Nexium 20 mg and start Pantoprazole 40 mg daily before breakfast.  Please see handouts given to you by your recovery nurse.  ACTIVITY:  You should plan to take it easy for the rest of today and you should NOT DRIVE or use heavy machinery until tomorrow (because of the  sedation medicines used during the test).    FOLLOW UP: Our staff will call the number listed on your records the next business day following your procedure to check on you and address any questions or concerns that you may have regarding the information given to you following your procedure. If we do not reach you, we will leave a message.  However, if you are feeling well and you are not experiencing any problems, there is no need to return our call.  We will assume that you have returned to your regular daily activities without incident.  If any biopsies were taken you will be contacted by phone or by letter within the next 1-3 weeks.  Please call us at [phone] if you have not heard about the biopsies in 3 weeks.   Thank you for allowing [place] to provide for your  healthcare needs today.  SIGNATURES/CONFIDENTIALITY: You and/or your care partner have signed paperwork which will be entered into your electronic medical record.  These signatures attest to the fact that that the information above on your After Visit Summary has been reviewed and is understood.  Full responsibility of the confidentiality of this discharge information lies with you and/or your care-partner.

## 2016-11-12 ENCOUNTER — Telehealth: Payer: Self-pay | Admitting: *Deleted

## 2016-11-12 NOTE — Telephone Encounter (Signed)
  Follow up Call-  Call back number 11/11/2016  Post procedure Call Back phone  # [phone]  Permission to leave phone message Yes  Some recent data might be hidden     Patient questions:  Do you have a fever, pain , or abdominal swelling? No. Pain Score  0 *  Have you tolerated food without any problems? Yes.    Have you been able to return to your normal activities? Yes.    Do you have any questions about your discharge instructions: Diet   No. Medications  No. Follow up visit  No.  Do you have questions or concerns about your Care? No.  Actions: * If pain score is 4 or above: No action needed, pain <4.

## 2016-11-17 DIAGNOSIS — R030 Elevated blood-pressure reading, without diagnosis of hypertension: Secondary | ICD-10-CM | POA: Diagnosis not present

## 2016-11-19 ENCOUNTER — Telehealth: Payer: Self-pay | Admitting: Internal Medicine

## 2016-11-19 NOTE — Telephone Encounter (Signed)
Patient advised that pantoprazole should not decrease appetite. Matthew Hansen he is asking if you have any ideas about why his appetite is decreased?

## 2016-11-19 NOTE — Telephone Encounter (Signed)
I don't know why his appetite is decreased. He can try something else like zantac 150mg  and see if that helps. Thanks

## 2016-11-19 NOTE — Telephone Encounter (Signed)
Patient notified

## 2016-11-20 ENCOUNTER — Telehealth: Payer: Self-pay | Admitting: Family Medicine

## 2016-11-20 NOTE — Telephone Encounter (Signed)
Noted  

## 2016-11-20 NOTE — Telephone Encounter (Signed)
Patient called to schedule an acute appt. States that he has been having testicular pain for the last few weeks. It has been a dull, radiating pain shooting from the left testicle to his naval area. Scheduled for 8;15 tomorrow with Dr Juleen China

## 2016-11-21 ENCOUNTER — Encounter: Payer: Self-pay | Admitting: Family Medicine

## 2016-11-21 ENCOUNTER — Ambulatory Visit (INDEPENDENT_AMBULATORY_CARE_PROVIDER_SITE_OTHER): Payer: 59 | Admitting: Family Medicine

## 2016-11-21 VITALS — BP 132/78 | HR 60 | Temp 98.2°F | Ht 74.0 in | Wt 208.6 lb

## 2016-11-21 DIAGNOSIS — R450 Nervousness: Secondary | ICD-10-CM

## 2016-11-21 DIAGNOSIS — R1033 Periumbilical pain: Secondary | ICD-10-CM

## 2016-11-21 DIAGNOSIS — R109 Unspecified abdominal pain: Secondary | ICD-10-CM

## 2016-11-21 DIAGNOSIS — N50819 Testicular pain, unspecified: Secondary | ICD-10-CM

## 2016-11-21 LAB — LIPASE: Lipase: 26 U/L (ref 11.0–59.0)

## 2016-11-21 LAB — POCT URINALYSIS DIPSTICK
Bilirubin, UA: NEGATIVE
Blood, UA: NEGATIVE
Glucose, UA: NEGATIVE
Ketones, UA: NEGATIVE
Leukocytes, UA: NEGATIVE
Nitrite, UA: NEGATIVE
Protein, UA: NEGATIVE
Spec Grav, UA: 1.025 (ref 1.010–1.025)
Urobilinogen, UA: 0.2 E.U./dL
pH, UA: 6 (ref 5.0–8.0)

## 2016-11-21 LAB — CBC WITH DIFFERENTIAL/PLATELET
Basophils Absolute: 0 10*3/uL (ref 0.0–0.1)
Basophils Relative: 1 % (ref 0.0–3.0)
Eosinophils Absolute: 0 10*3/uL (ref 0.0–0.7)
Eosinophils Relative: 1 % (ref 0.0–5.0)
HCT: 43.7 % (ref 39.0–52.0)
Hemoglobin: 14.5 g/dL (ref 13.0–17.0)
Lymphocytes Relative: 19.2 % (ref 12.0–46.0)
Lymphs Abs: 0.9 10*3/uL (ref 0.7–4.0)
MCHC: 33.2 g/dL (ref 30.0–36.0)
MCV: 88.1 fl (ref 78.0–100.0)
Monocytes Absolute: 0.5 10*3/uL (ref 0.1–1.0)
Monocytes Relative: 10.6 % (ref 3.0–12.0)
Neutro Abs: 3 10*3/uL (ref 1.4–7.7)
Neutrophils Relative %: 68.2 % (ref 43.0–77.0)
Platelets: 161 10*3/uL (ref 150.0–400.0)
RBC: 4.95 Mil/uL (ref 4.22–5.81)
RDW: 12.9 % (ref 11.5–15.5)
WBC: 4.4 10*3/uL (ref 4.0–10.5)

## 2016-11-21 LAB — COMPREHENSIVE METABOLIC PANEL
ALT: 19 U/L (ref 0–53)
AST: 15 U/L (ref 0–37)
Albumin: 4.4 g/dL (ref 3.5–5.2)
Alkaline Phosphatase: 45 U/L (ref 39–117)
BUN: 13 mg/dL (ref 6–23)
CO2: 27 mEq/L (ref 19–32)
Calcium: 9.6 mg/dL (ref 8.4–10.5)
Chloride: 104 mEq/L (ref 96–112)
Creatinine, Ser: 1.18 mg/dL (ref 0.40–1.50)
GFR: 72.99 mL/min (ref >60.00)
Glucose, Bld: 92 mg/dL (ref 70–99)
Potassium: 4.4 mEq/L (ref 3.5–5.1)
Sodium: 138 mEq/L (ref 135–145)
Total Bilirubin: 1.2 mg/dL (ref 0.2–1.2)
Total Protein: 6.7 g/dL (ref 6.0–8.3)

## 2016-11-21 LAB — PSA: PSA: 0.42 ng/mL (ref 0.10–4.00)

## 2016-11-21 NOTE — Patient Instructions (Signed)
Recommend switching back to Nexium.  Take other medications if needed.

## 2016-11-21 NOTE — Progress Notes (Signed)
Matthew Hansen is a 41 y.o. male here for an acute visit.  History of Present Illness:   Water quality scientist, CMA, acting as scribe for Dr. Juleen China.  HPI: Patient states he was recently diagnosed with esophagitis.  States he has been having pain for a couple of weeks.  Started around his navel.  Sometimes has testicular pain as well.  This pain is on the left side.  Described as a dull ache.  He states he hasn't felt any lumps in his testicles upon self examination.  He has been taking Protonix in the morning for his esophagitis.  He states this makes him feel jittery.  States it makes him feel anxious.  He stopped taking Protonix and started taking Nexium.  Then he felt he shouldn't do that and restarted Protonix this morning.  He states that when he takes Protonix he has to force himself to eat in the morning.  He takes Zantac at night and takes Carafate 4 times per day.   He has been urinating more frequently but states he thinks this is due to a new supplement he just started taking.  No urinary symptoms such as burning or foul odor.  He thought his pain was gas at first but now wonders if it is due to his medications or due to anxiety.  He has lost 8 pounds recently due to not eating as much and he stopped drinking beer.  He doesn't have any rectal pain.  No history of abdominal surgery.  No history of constipation or diarrhea.  No family history of Chron's or IBS.  States he was told he had an umbilical hernia several years ago.  Patient is a Airline pilot.   States he has back pain as well.  He doesn't take anything for pain because he is unsure what he can take.  He was started on Lexapro about 7 days ago.    PMHx, SurgHx, SocialHx, Medications, and Allergies were reviewed in the Visit Navigator and updated as appropriate.  Current Medications:   .  escitalopram (LEXAPRO) 10 MG tablet, Take 1 tablet (10 mg total) by mouth at bedtime., Disp: 30 tablet, Rfl: 3 .  fluticasone (FLONASE) 50 MCG/ACT nasal  spray, Place 2 sprays into both nostrils daily., Disp: 16 g, Rfl: 6 .  Multiple Vitamin (MULTIVITAMIN) tablet, Take 1 tablet by mouth daily., Disp: , Rfl:  .  Omega-3 Fatty Acids (FISH OIL PO), Take by mouth., Disp: , Rfl:  .  pantoprazole (PROTONIX) 40 MG tablet, Take 1 tablet (40 mg total) by mouth daily before breakfast. 30-60 minutes before, Disp: 90 tablet, Rfl: 3 .  Probiotic Product (PROBIOTIC-10) CAPS, Take by mouth., Disp: , Rfl:  .  ranitidine (ZANTAC) 150 MG tablet, Take 150 mg by mouth at bedtime., Disp: , Rfl:  .  sucralfate (CARAFATE) 1 g tablet, Take 1 tablet (1 g total) by mouth 4 (four) times daily -  with meals and at bedtime., Disp: 120 tablet, Rfl: 0   Allergies  Allergen Reactions  . Meloxicam Other (See Comments)    Metal taste in mouth  . Naproxen Other (See Comments)    Dizziness   Review of Systems:   Pertinent items are noted in the HPI. Otherwise, ROS is negative.  Vitals:   Vitals:   11/21/16 0827  BP: 132/78  Pulse: 60  Temp: 98.2 F (36.8 C)  TempSrc: Oral  SpO2: 98%  Weight: 208 lb 9.6 oz (94.6 kg)  Height: 6\' 2"  (1.88 m)  Body mass index is 26.78 kg/m.  Physical Exam:   Physical Exam  Constitutional: He is oriented to person, place, and time. He appears well-developed and well-nourished. No distress.  HENT:  Head: Normocephalic and atraumatic.  Eyes: Pupils are equal, round, and reactive to light. Conjunctivae and EOM are normal.  Neck: Normal range of motion. Neck supple.  Cardiovascular: Normal rate, regular rhythm, normal heart sounds and intact distal pulses.   Pulmonary/Chest: Effort normal and breath sounds normal.  Abdominal: Soft. Bowel sounds are normal. There is tenderness in the periumbilical area. There is no rigidity, no rebound and no guarding.    Neurological: He is alert and oriented to person, place, and time.  Skin: Skin is warm and dry.  Psychiatric: He has a normal mood and affect. His behavior is normal.  Judgment and thought content normal.  Nursing note and vitals reviewed.  Results for orders placed or performed in visit on 11/21/16  POCT urinalysis dipstick  Result Value Ref Range   Color, UA Yellow    Clarity, UA Clear    Glucose, UA Negative    Bilirubin, UA Negative    Ketones, UA Negative    Spec Grav, UA 1.025 1.010 - 1.025   Blood, UA Negative    pH, UA 6.0 5.0 - 8.0   Protein, UA Negative    Urobilinogen, UA 0.2 0.2 or 1.0 E.U./dL   Nitrite, UA Negative    Leukocytes, UA Negative Negative   EKG: normal EKG, normal sinus rhythm, no prolonged QT interval.  Assessment and Plan:   Matthew Hansen was seen today for acute visit.  Diagnoses and all orders for this visit:  Periumbilical abdominal pain Comments: Patient was told in the past that he has an umbilical hernia. 2009, 2013 CT WITHOUT CONTRAST without evidence. High enough suspicion today to obtain CT WITH CONTRAST. Labs pending. Orders: -     CT ABDOMEN PELVIS W WO CONTRAST; Future -     CBC with Differential/Platelet -     Comprehensive metabolic panel -     Lipase  Testicular pain Comments: Testicular exam WNL. Pain is referred from abdomen. See above.  Orders: -     CT ABDOMEN PELVIS W WO CONTRAST; Future -     CBC with Differential/Platelet -     Comprehensive metabolic panel -     Lipase -     Cancel: EKG 12-Lead -     POCT urinalysis dipstick -     EKG 12-Lead -     PSA  Jittery feeling Comments: EKG ordered and reviewed to ensure no QT prolongation causing issues.  Orders: -     EKG 12-Lead   . Reviewed expectations re: course of current medical issues. . Discussed self-management of symptoms. . Outlined signs and symptoms indicating need for more acute intervention. . Patient verbalized understanding and all questions were answered. Marland Kitchen Health Maintenance issues including appropriate healthy diet, exercise, and smoking avoidance were discussed with patient. . See orders for this visit as  documented in the electronic medical record. . Patient received an After Visit Summary.  CMA served as Education administrator during this visit. History, Physical, and Plan performed by medical provider. The above documentation has been reviewed and is accurate and complete. Briscoe Deutscher, D.O.  Briscoe Deutscher, DO [place] [place] [facility] 11/21/2016

## 2016-11-22 ENCOUNTER — Telehealth: Payer: Self-pay | Admitting: Family Medicine

## 2016-11-22 DIAGNOSIS — N50819 Testicular pain, unspecified: Secondary | ICD-10-CM

## 2016-11-22 DIAGNOSIS — R1033 Periumbilical pain: Secondary | ICD-10-CM

## 2016-11-22 NOTE — Telephone Encounter (Signed)
Miriam from [facility] called to inquire about the order that was placed for an MRI with and without contrast. Call [facility] to advise as soon as possible.

## 2016-11-22 NOTE — Telephone Encounter (Signed)
Called and left voicemail for Matthew Hansen to return call to office.

## 2016-11-25 NOTE — Telephone Encounter (Signed)
Called [facility] and spoke to [place], she said the order needs to be changed to CT abdomen pelvis with contrast and then they will call and schedule pt. Told her okay I will change order right away. Miriam verbalized understanding.  New order put in EPIC and old order cancelled.

## 2016-11-28 ENCOUNTER — Ambulatory Visit
Admission: RE | Admit: 2016-11-28 | Discharge: 2016-11-28 | Disposition: A | Discharge status: Home / Self Care | Payer: 59 | Source: Ambulatory Visit | Attending: Family Medicine | Admitting: Family Medicine

## 2016-11-28 DIAGNOSIS — N50819 Testicular pain, unspecified: Secondary | ICD-10-CM

## 2016-11-28 DIAGNOSIS — R10815 Periumbilic abdominal tenderness: Secondary | ICD-10-CM | POA: Diagnosis not present

## 2016-11-28 DIAGNOSIS — R1033 Periumbilical pain: Secondary | ICD-10-CM

## 2016-11-28 MED ORDER — IOPAMIDOL (ISOVUE-300) INJECTION 61%
100.0000 mL | Freq: Once | INTRAVENOUS | Status: AC | PRN
  Administered 2016-11-28: 100 mL via INTRAVENOUS

## 2016-12-03 ENCOUNTER — Encounter: Payer: Self-pay | Admitting: Family Medicine

## 2016-12-04 ENCOUNTER — Ambulatory Visit (INDEPENDENT_AMBULATORY_CARE_PROVIDER_SITE_OTHER): Payer: 59 | Admitting: Family Medicine

## 2016-12-04 ENCOUNTER — Encounter: Payer: Self-pay | Admitting: Family Medicine

## 2016-12-04 VITALS — BP 136/84 | HR 71 | Temp 98.2°F | Wt 211.0 lb

## 2016-12-04 DIAGNOSIS — K2970 Gastritis, unspecified, without bleeding: Secondary | ICD-10-CM

## 2016-12-04 DIAGNOSIS — J0100 Acute maxillary sinusitis, unspecified: Secondary | ICD-10-CM

## 2016-12-05 ENCOUNTER — Telehealth: Payer: Self-pay | Admitting: Family Medicine

## 2016-12-05 NOTE — Telephone Encounter (Signed)
MEDICATION: AUGMENTIN  PHARMACY:  [id number] - [place], [place] - [address] AT [place] [phone] (Phone) [phone] (Fax)    IS THIS A 90 DAY SUPPLY : unknown; what Debbie wrote yesterday (per patient)  IS PATIENT OUT OF MEDICTAION: yes, never recieved  IF NOT; HOW MUCH IS LEFT: n/a  LAST APPOINTMENT DATE:12/04/16  NEXT APPOINTMENT DATE:n/a  OTHER COMMENTS:  Patient needs clinical staff to call the pharmacy  To clarify the mg on the rx call pharmacy below: [id number] - [place], [place]. AT [address] [place] [phone] (Phone) [phone] (Fax)    **Let patient know to contact pharmacy at the end of the day to make sure medication is ready. **  ** Please notify patient to allow 48-72 hours to process**  **Encourage patient to contact the pharmacy for refills or they can request refills through [facility]**

## 2016-12-06 ENCOUNTER — Encounter: Payer: Self-pay | Admitting: Family Medicine

## 2016-12-06 ENCOUNTER — Other Ambulatory Visit: Payer: Self-pay | Admitting: Internal Medicine

## 2016-12-06 MED ORDER — AMOXICILLIN-POT CLAVULANATE 875-125 MG PO TABS: 1.0000 | ORAL_TABLET | Freq: Two times a day (BID) | ORAL | 0 refills | Status: DC

## 2016-12-06 NOTE — Progress Notes (Signed)
Subjective:    Patient ID: Matthew Hansen, male    DOB: [birth year], 41 y.o.   MRN: [id number]  HPI Patient visit was 12/04/2016- encounter was not able to be opened until 8/92018 because of system wide EPIC outage.  This is a 41 yo male who presents today with sinus pressure worse on left side x 1 month. Has been using flonase daily without much relief. Was seen at beginning of symptoms at [facility] and was prescribed amoxicillin and prednisone which he did not finish because during that time he developed stomach problems and has recently undergone workup which showed gastritis. He is having little nasal drainage but a lot of post nasal drainage. He is unable to take Sudafed because he doesn't like the way it makes him feel. He had a dental xray recently and was told his left sinus was full of fluid. He is a Science writer. Has not been taking regular antihistamine, but started Zyrtec today. No fever, no cough, no fever.    Stomach has been doing much better on Nexium. He is taking 40 mg.   Anxiety has dramatically improved with negative GI workup. He tried Lexapro for about a week, but didn't like the way it made him feel. He used a few clonazepam with good relief but is very aware of potential for dependence so is not using any more. Feels like he is managing stress pretty well now.   Past Medical History:  Diagnosis Date  . Umbilical hernia    Past Surgical History:  Procedure Laterality Date  . TOTAL HIP ARTHROPLASTY  03/27/2016   Family History  Problem Relation Age of Onset  . Colon cancer Paternal Grandfather   . Hypertension Mother   . COPD Mother   . Hypertension Father    Social History  Substance Use Topics  . Smoking status: Never Smoker  . Smokeless tobacco: Current User    Types: Snuff  . Alcohol use Yes     Comment: 2-3 per week    Review of Systems Per HPI    Objective:   Physical Exam  Constitutional: He is oriented to person, place, and time.  He appears well-developed and well-nourished. No distress.  HENT:  Head: Normocephalic and atraumatic.  Right Ear: External ear normal.  Left Ear: External ear normal.  Nose: Right sinus exhibits no maxillary sinus tenderness and no frontal sinus tenderness. Left sinus exhibits maxillary sinus tenderness. Left sinus exhibits no frontal sinus tenderness.  Mouth/Throat: Mucous membranes are normal. Posterior oropharyngeal erythema (mild) present.  Eyes: Conjunctivae are normal.  Neck: Normal range of motion. Neck supple.  Cardiovascular: Normal rate, regular rhythm and normal heart sounds.   Pulmonary/Chest: Effort normal and breath sounds normal.  Lymphadenopathy:    He has no cervical adenopathy.  Neurological: He is alert and oriented to person, place, and time.  Skin: Skin is warm and dry. He is not diaphoretic.  Psychiatric: He has a normal mood and affect. His behavior is normal. Judgment and thought content normal.  Vitals reviewed.     BP 136/84   Pulse 71   Temp 98.2 F (36.8 C) (Oral)   Wt 211 lb (95.7 kg)   SpO2 98%   BMI 27.09 kg/m  Wt Readings from Last 3 Encounters:  12/04/16 211 lb (95.7 kg)  11/21/16 208 lb 9.6 oz (94.6 kg)  11/11/16 216 lb (98 kg)       Assessment & Plan:  1. Acute non-recurrent maxillary sinusitis -  discussed diagnosis with patient and he was instructed to continue Zyrtec, flonase, add saline rinses and can use Afrin nasal spray short term. - if not better at end of antibiotic course, will consider extending treatment- he will let me know - amoxicillin-clavulanate (AUGMENTIN) 875-125 MG tablet; Take 1 tablet by mouth 2 (two) times daily.  Dispense: 14 tablet; Refill: 0 - RTC precautions reviewed  2. Gastritis without bleeding, unspecified chronicity, unspecified gastritis type - he was given prescription for Nexium 40 mg po qd and will discuss weaning if he is doing ok after 3 months   Clarene Reamer, FNP-BC  [facility]  12/06/2016 10:38 AM

## 2016-12-06 NOTE — Telephone Encounter (Signed)
Please advise 

## 2016-12-06 NOTE — Telephone Encounter (Signed)
Called pharmacy and was told it was filled at a different pharmacy.  Please call patient and make sure he received his augmentin.

## 2016-12-06 NOTE — Telephone Encounter (Signed)
Prescription was sent to pharmacy electronically. Pt aware nothing further needed at this time.

## 2016-12-12 ENCOUNTER — Telehealth: Payer: Self-pay | Admitting: Internal Medicine

## 2016-12-12 NOTE — Telephone Encounter (Signed)
Patient was concerned that he is having some nausea upon waking, currently taking Augmentin for sinus infection. Explained that this could be related to that medication, he only has a couple days left of this treatment. Reading back through notes he had tried Protonix, he felt it was not helping, was advised to switch to Zantac. PCP put him on Nexium 40 mg daily, he takes Zantac at night. Patient wants to know how long he will need to be on this medication, is this the best alternative patient asked about Dexilant. Thanks.

## 2016-12-12 NOTE — Telephone Encounter (Signed)
Patient reminded to follow GERD lifestyle and what that entails. He will stay on current mediation and has a follow up appointment with Dr. Carlean Purl on 10/12.

## 2016-12-12 NOTE — Telephone Encounter (Signed)
There is no one best PPI - best is what works best for patient  I recommend he stay on current tx and make a next available appointment with me and we can discuss long-term Tx  Emphasize GERD lifestyle changes  You were correct in telling him that Augmentin may be causing GI sxs now and that can linger a few days or more after Tx complete

## 2016-12-16 ENCOUNTER — Encounter: Payer: Self-pay | Admitting: Family Medicine

## 2016-12-16 ENCOUNTER — Other Ambulatory Visit: Payer: Self-pay | Admitting: Family Medicine

## 2016-12-16 DIAGNOSIS — J320 Chronic maxillary sinusitis: Secondary | ICD-10-CM

## 2016-12-16 DIAGNOSIS — H9319 Tinnitus, unspecified ear: Secondary | ICD-10-CM

## 2016-12-19 ENCOUNTER — Encounter: Payer: Self-pay | Admitting: Family Medicine

## 2016-12-21 ENCOUNTER — Encounter: Payer: Self-pay | Admitting: Internal Medicine

## 2016-12-23 ENCOUNTER — Encounter: Payer: Self-pay | Admitting: Family Medicine

## 2016-12-23 ENCOUNTER — Other Ambulatory Visit: Payer: Self-pay

## 2016-12-23 DIAGNOSIS — R1011 Right upper quadrant pain: Secondary | ICD-10-CM

## 2016-12-26 DIAGNOSIS — J329 Chronic sinusitis, unspecified: Secondary | ICD-10-CM | POA: Diagnosis not present

## 2016-12-26 DIAGNOSIS — J342 Deviated nasal septum: Secondary | ICD-10-CM | POA: Diagnosis not present

## 2016-12-29 ENCOUNTER — Encounter: Payer: Self-pay | Admitting: Family Medicine

## 2016-12-29 ENCOUNTER — Encounter: Payer: Self-pay | Admitting: Internal Medicine

## 2016-12-31 ENCOUNTER — Other Ambulatory Visit: Payer: Self-pay | Admitting: Otolaryngology

## 2016-12-31 DIAGNOSIS — J329 Chronic sinusitis, unspecified: Secondary | ICD-10-CM

## 2017-01-01 ENCOUNTER — Other Ambulatory Visit: Payer: Self-pay | Admitting: Internal Medicine

## 2017-01-02 ENCOUNTER — Other Ambulatory Visit: Payer: 59

## 2017-01-03 NOTE — Telephone Encounter (Signed)
Spoke to him  He has lost #20 - diet is excellent re: GERD prevention  Will see if he can transition to ranitidine 150 mg bid and come off PPI  He may cancel HIDA

## 2017-01-06 ENCOUNTER — Other Ambulatory Visit: Payer: Self-pay | Admitting: Family Medicine

## 2017-01-06 MED ORDER — DULOXETINE HCL 30 MG PO CPEP: ORAL_CAPSULE | ORAL | 2 refills | Status: DC

## 2017-01-09 ENCOUNTER — Other Ambulatory Visit: Payer: Self-pay | Admitting: Family Medicine

## 2017-01-09 MED ORDER — CLONAZEPAM 0.5 MG PO TABS: 0.2500 mg | ORAL_TABLET | Freq: Two times a day (BID) | ORAL | 0 refills | Status: DC | PRN

## 2017-01-09 NOTE — Progress Notes (Signed)
Called into pharmacy

## 2017-01-12 ENCOUNTER — Encounter: Payer: Self-pay | Admitting: Family Medicine

## 2017-01-13 ENCOUNTER — Encounter: Payer: Self-pay | Admitting: Internal Medicine

## 2017-01-13 NOTE — Telephone Encounter (Signed)
Left message for patient to call back  

## 2017-02-06 DIAGNOSIS — J320 Chronic maxillary sinusitis: Secondary | ICD-10-CM | POA: Diagnosis not present

## 2017-02-06 DIAGNOSIS — M26623 Arthralgia of bilateral temporomandibular joint: Secondary | ICD-10-CM | POA: Diagnosis not present

## 2017-02-06 DIAGNOSIS — J310 Chronic rhinitis: Secondary | ICD-10-CM | POA: Diagnosis not present

## 2017-02-07 ENCOUNTER — Ambulatory Visit (INDEPENDENT_AMBULATORY_CARE_PROVIDER_SITE_OTHER): Payer: 59 | Admitting: Internal Medicine

## 2017-02-07 ENCOUNTER — Encounter: Payer: Self-pay | Admitting: Internal Medicine

## 2017-02-07 ENCOUNTER — Other Ambulatory Visit (INDEPENDENT_AMBULATORY_CARE_PROVIDER_SITE_OTHER): Payer: 59

## 2017-02-07 VITALS — BP 124/84 | HR 86 | Ht 74.0 in | Wt 199.8 lb

## 2017-02-07 DIAGNOSIS — F418 Other specified anxiety disorders: Secondary | ICD-10-CM | POA: Diagnosis not present

## 2017-02-07 DIAGNOSIS — K30 Functional dyspepsia: Secondary | ICD-10-CM | POA: Diagnosis not present

## 2017-02-07 DIAGNOSIS — R1011 Right upper quadrant pain: Secondary | ICD-10-CM | POA: Diagnosis not present

## 2017-02-07 LAB — IGA: IGA: 142 mg/dL (ref 68–378)

## 2017-02-07 NOTE — Patient Instructions (Signed)
Your physician has requested that you go to the basement for the following lab work before leaving today: TTG, IGA   Today we are giving you samples of FDgard to try. Take 2 capsules before meals. This can be purchased over the counter.   I appreciate the opportunity to care for you. Silvano Rusk, MD, [facility]

## 2017-02-07 NOTE — Progress Notes (Signed)
Matthew Hansen 41 y.o. 05-09-1975 [id number]  Assessment & Plan:   Encounter Diagnoses  Name Primary?  . Functional dyspepsia Yes  . RUQ pain   . Anxiety about health     He has failed PPIs. H2 blocker might be helping. Carafate has not helped. He does not want to take anxiolytics or SSRI. He didn't try duloxetine. He had symptoms before becoming ill in [place] this summer though I wonder if there is not a component of a postinfectious problem.  My current plan is to test for celiac disease though unlikely as possible and should not be missed. TTG antibody and IgA level will be checked.  We'll try FD gard  Stay on Zantac for now  I will contact him with results and further plans  I reassured him as best as I could reviewing his CT scan labs etc.  Other considerations would be a trial of glutamine though I think that's more for diarrhea predominant postinfectious IBS, and if we have not tried buspirone that might be worthwhile.  I appreciate the opportunity to care for this patient. CC: Matthew Beck, FNP  Subjective:   Chief Complaint:Right upper quadrant pain.  HPI Patient is here for follow-up. He's had persistent problems largely with a burning sensation in the right upper quadrant area. He has admittedly been anxious about this. Ultrasound, CT abdomen and pelvis EGD essentially unrevealing. He had some minimal esophagitis changes at EGD. We have tried multiple PPIs none of which is helped. Carafate did not help. Anti-cholinergics have not helped. He has eliminated certain foods in his diet he doesn't drink any beer. The other day he was started on clindamycin for sinusitis problems and that burned terribly so he is switched to Augmentin. He has not had a HIDA scan with ejection fraction. He has decided not to do so. 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 He is under some stress. Busy family life. He  has tried escitalopram and clonazepam.  Wt Readings from Last 3 Encounters:  02/07/17 199 lb 12.8 oz (90.6 kg)  12/04/16 211 lb (95.7 kg)  11/21/16 208 lb 9.6 oz (94.6 kg)    Allergies  Allergen Reactions  . Meloxicam Other (See Comments)    Metal taste in mouth  . Naproxen Other (See Comments)    Dizziness   Current Meds  Medication Sig  . amoxicillin-clavulanate (AUGMENTIN) 875-125 MG tablet Take 1 tablet by mouth 2 (two) times daily.  . fluticasone (FLONASE) 50 MCG/ACT nasal spray Place 2 sprays into both nostrils daily.  . Multiple Vitamin (MULTIVITAMIN) tablet Take 1 tablet by mouth daily.  . Omega-3 Fatty Acids (FISH OIL PO) Take by mouth.  Marland Kitchen OVER THE COUNTER MEDICATION FDgard, take 2 capsules before meals.  . Probiotic Product (PROBIOTIC-10) CAPS Take by mouth.  . ranitidine (ZANTAC) 150 MG tablet Take 150 mg by mouth 2 (two) times daily.   Past Medical History:  Diagnosis Date  . Anxiety   . Gastritis   . GERD (gastroesophageal reflux disease)   . Umbilical hernia    Past Surgical History:  Procedure Laterality Date  . ESOPHAGOGASTRODUODENOSCOPY  10/2016  . TOTAL HIP ARTHROPLASTY  03/27/2016    Review of Systems As above  Objective:   Physical Exam BP 124/84   Pulse 86   Ht 6\' 2"  (1.88 m)   Wt 199 lb 12.8 oz (90.6 kg)   SpO2 98%   BMI  25.65 kg/m  No acute distress  15 minutes time spent with patient > half in counseling coordination of care

## 2017-02-10 LAB — TISSUE TRANSGLUTAMINASE, IGA: (tTG) Ab, IgA: 1 U/mL

## 2017-02-11 ENCOUNTER — Other Ambulatory Visit: Payer: Self-pay | Admitting: Otolaryngology

## 2017-02-11 DIAGNOSIS — J329 Chronic sinusitis, unspecified: Secondary | ICD-10-CM

## 2017-02-12 ENCOUNTER — Ambulatory Visit
Admission: RE | Admit: 2017-02-12 | Discharge: 2017-02-12 | Disposition: A | Discharge status: Home / Self Care | Payer: 59 | Source: Ambulatory Visit | Attending: Otolaryngology | Admitting: Otolaryngology

## 2017-02-12 DIAGNOSIS — J329 Chronic sinusitis, unspecified: Secondary | ICD-10-CM | POA: Diagnosis not present

## 2017-02-13 NOTE — Progress Notes (Signed)
Message to patient - does not have celiac disease

## 2017-02-14 ENCOUNTER — Other Ambulatory Visit: Payer: Self-pay | Admitting: Otolaryngology

## 2017-02-14 DIAGNOSIS — J329 Chronic sinusitis, unspecified: Secondary | ICD-10-CM

## 2017-03-01 ENCOUNTER — Encounter: Payer: Self-pay | Admitting: Internal Medicine

## 2017-03-24 DIAGNOSIS — Z96649 Presence of unspecified artificial hip joint: Secondary | ICD-10-CM | POA: Diagnosis not present

## 2017-03-24 DIAGNOSIS — M1610 Unilateral primary osteoarthritis, unspecified hip: Secondary | ICD-10-CM | POA: Diagnosis not present

## 2017-03-24 DIAGNOSIS — M25551 Pain in right hip: Secondary | ICD-10-CM | POA: Diagnosis not present

## 2017-03-24 DIAGNOSIS — Z96641 Presence of right artificial hip joint: Secondary | ICD-10-CM | POA: Diagnosis not present

## 2017-04-02 DIAGNOSIS — D229 Melanocytic nevi, unspecified: Secondary | ICD-10-CM | POA: Diagnosis not present

## 2017-04-17 DIAGNOSIS — M9902 Segmental and somatic dysfunction of thoracic region: Secondary | ICD-10-CM | POA: Diagnosis not present

## 2017-04-17 DIAGNOSIS — M9901 Segmental and somatic dysfunction of cervical region: Secondary | ICD-10-CM | POA: Diagnosis not present

## 2017-04-17 DIAGNOSIS — R51 Headache: Secondary | ICD-10-CM | POA: Diagnosis not present

## 2017-05-23 DIAGNOSIS — R0789 Other chest pain: Secondary | ICD-10-CM | POA: Diagnosis not present

## 2017-05-23 DIAGNOSIS — K219 Gastro-esophageal reflux disease without esophagitis: Secondary | ICD-10-CM | POA: Diagnosis not present

## 2017-05-23 DIAGNOSIS — K210 Gastro-esophageal reflux disease with esophagitis: Secondary | ICD-10-CM | POA: Diagnosis not present

## 2017-06-03 DIAGNOSIS — K219 Gastro-esophageal reflux disease without esophagitis: Secondary | ICD-10-CM | POA: Diagnosis not present

## 2017-06-03 DIAGNOSIS — K210 Gastro-esophageal reflux disease with esophagitis: Secondary | ICD-10-CM | POA: Diagnosis not present

## 2017-09-12 DIAGNOSIS — M25552 Pain in left hip: Secondary | ICD-10-CM | POA: Diagnosis not present

## 2017-09-12 DIAGNOSIS — Z96649 Presence of unspecified artificial hip joint: Secondary | ICD-10-CM | POA: Diagnosis not present

## 2017-09-12 DIAGNOSIS — M1610 Unilateral primary osteoarthritis, unspecified hip: Secondary | ICD-10-CM | POA: Diagnosis not present

## 2017-09-12 DIAGNOSIS — Z96641 Presence of right artificial hip joint: Secondary | ICD-10-CM | POA: Diagnosis not present

## 2017-10-20 IMAGING — CT CT MAXILLOFACIAL W/O CM
1 series · 15 of 30 positions shown, 19 images · non-contrast
Comparison: None.

CLINICAL DATA: Chronic sinusitis with maxillary pressure and ear
pain. On antibiotics.

EXAM:
CT MAXILLOFACIAL WITHOUT CONTRAST
TECHNIQUE: Multidetector CT images of the paranasal sinuses were obtained using
the standard protocol without intravenous contrast.

[Series 4: maxofacial soft · axial · 0.38mm/px · z∈[-202,-84]mm · 15 of 43 slices shown, 19 images]
[im 2/43  brain]
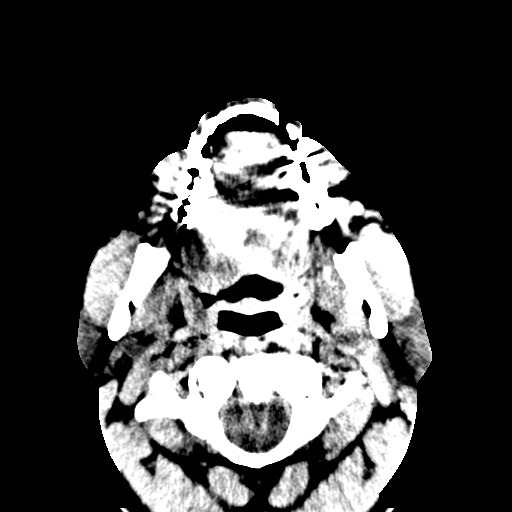
[im 2/43  bone]
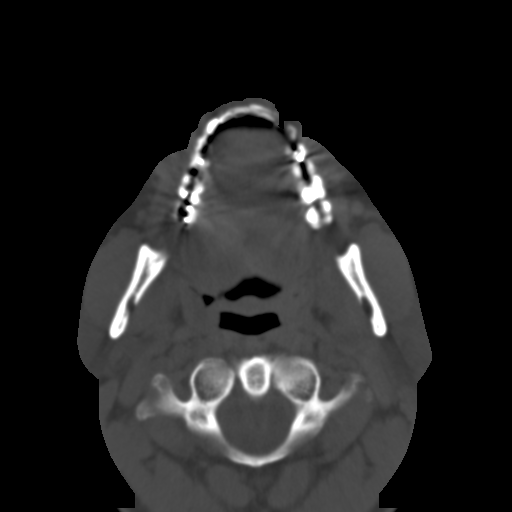
[im 5/43  bone]
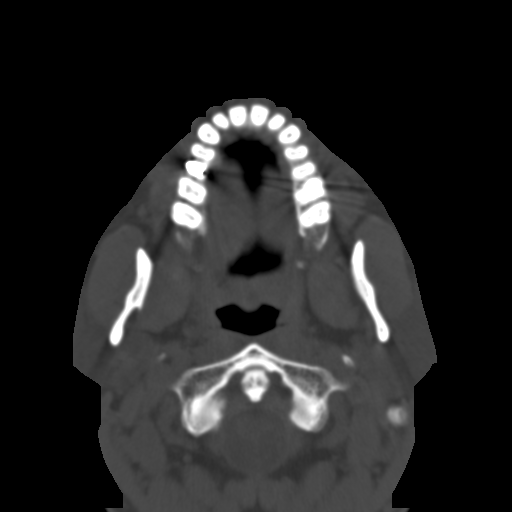
[im 8/43  bone]
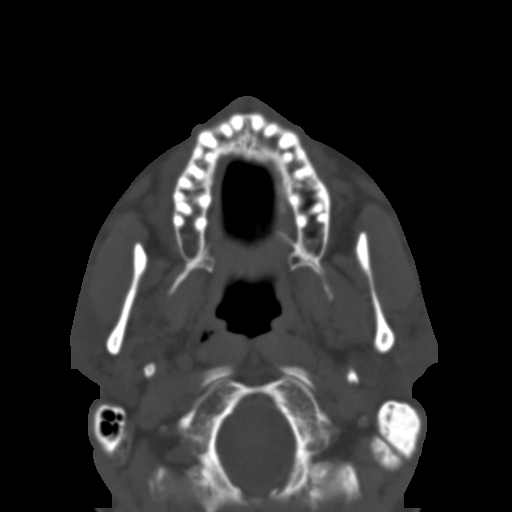
[im 11/43  bone]
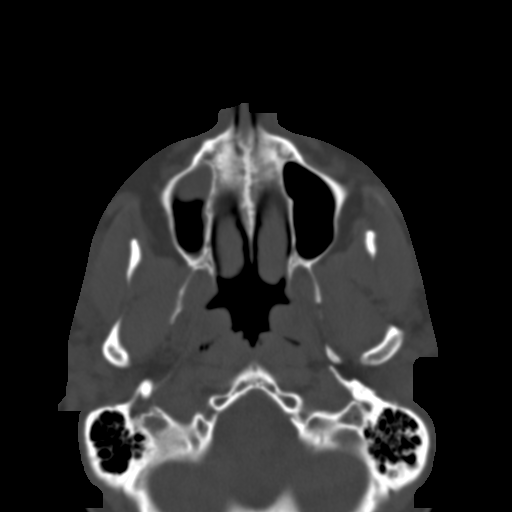
[im 14/43  brain]
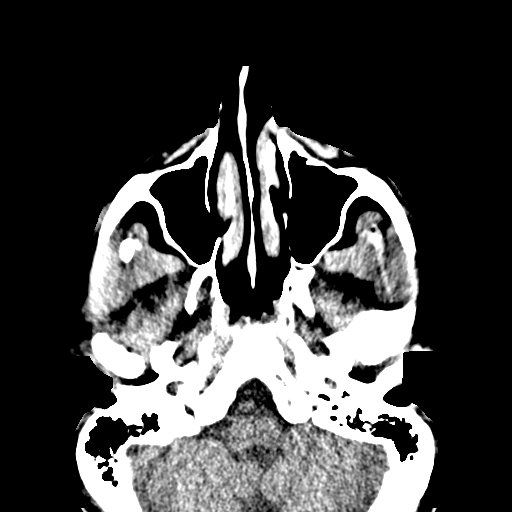
[im 14/43  bone]
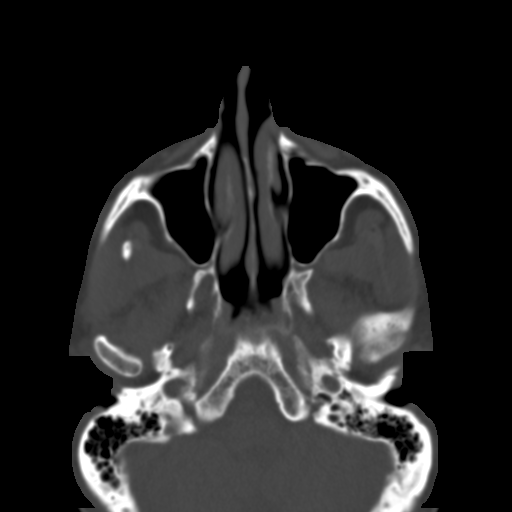
[im 16/43  bone]
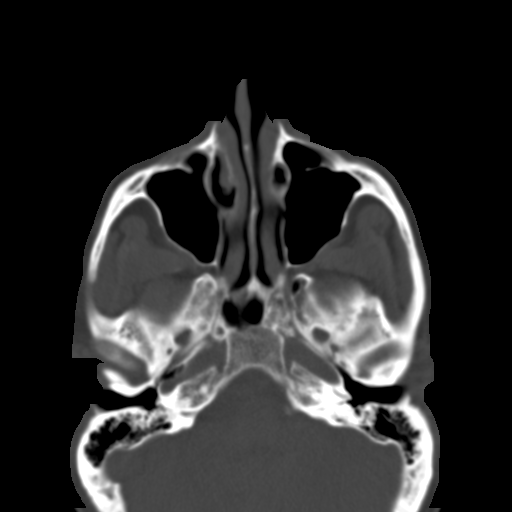
[im 19/43  bone]
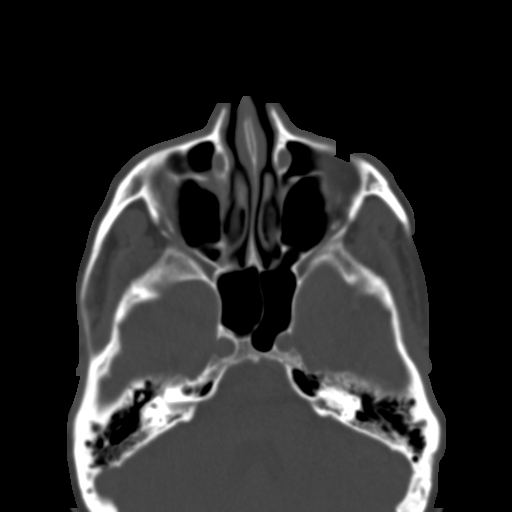
[im 22/43  bone]
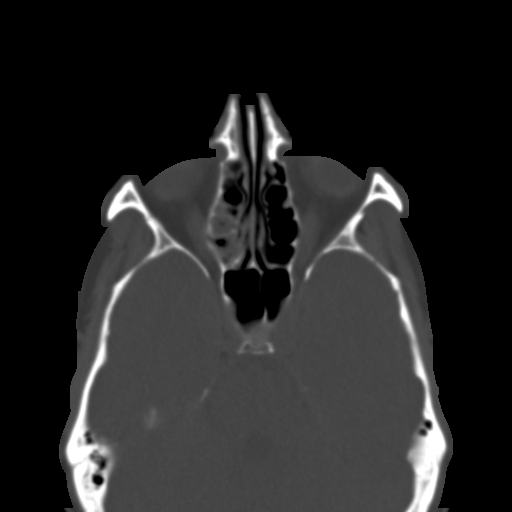
[im 24/43  brain]
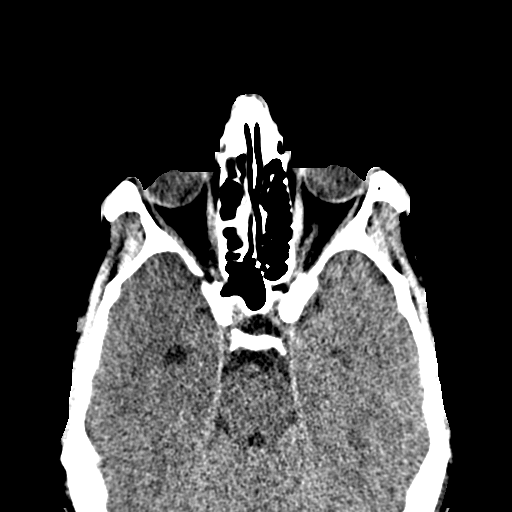
[im 24/43  bone]
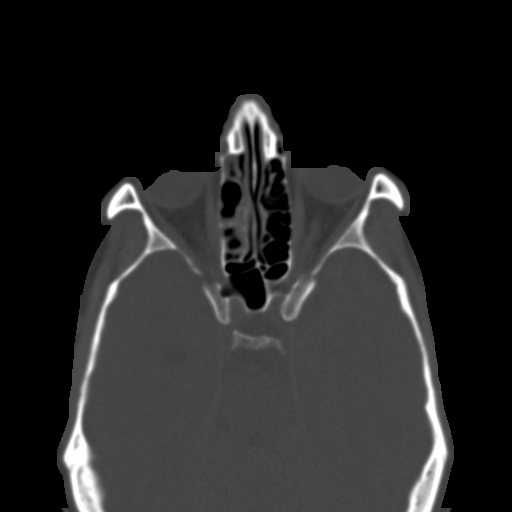
[im 27/43  bone]
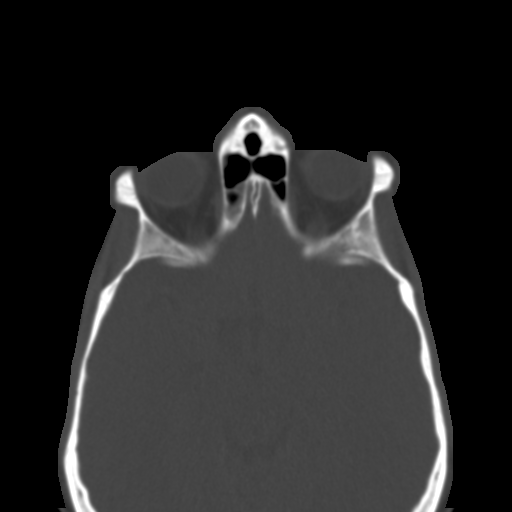
[im 29/43  bone]
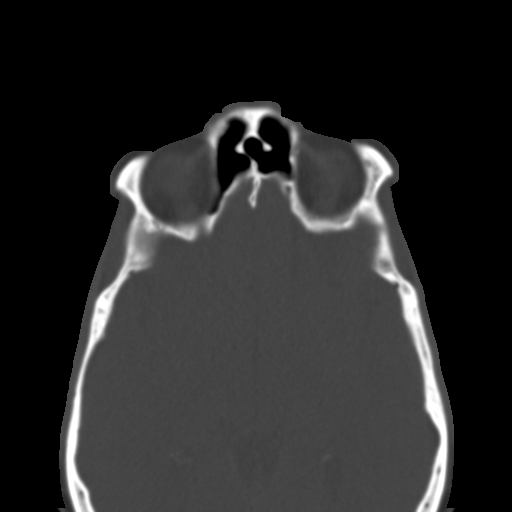
[im 32/43  bone]
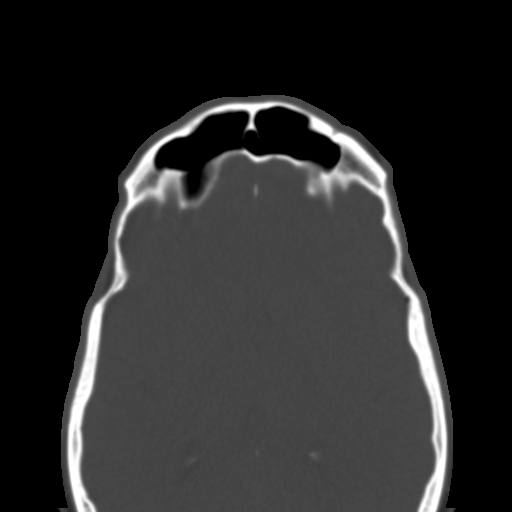
[im 35/43  brain]
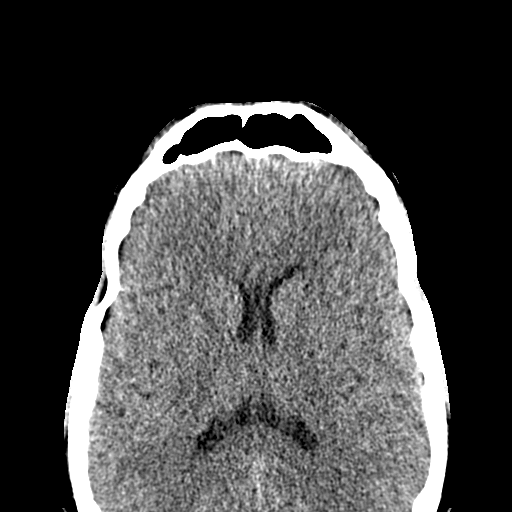
[im 35/43  bone]
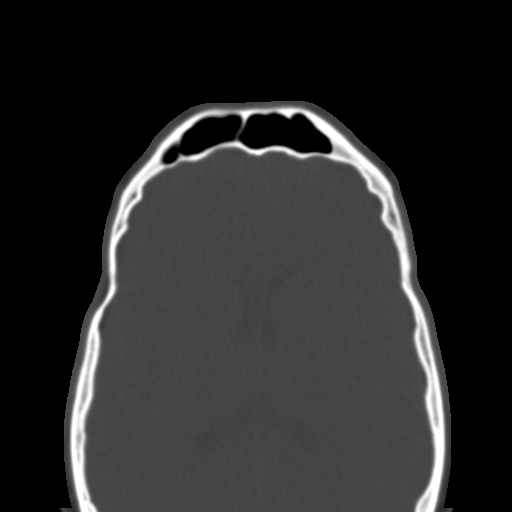
[im 38/43  bone]
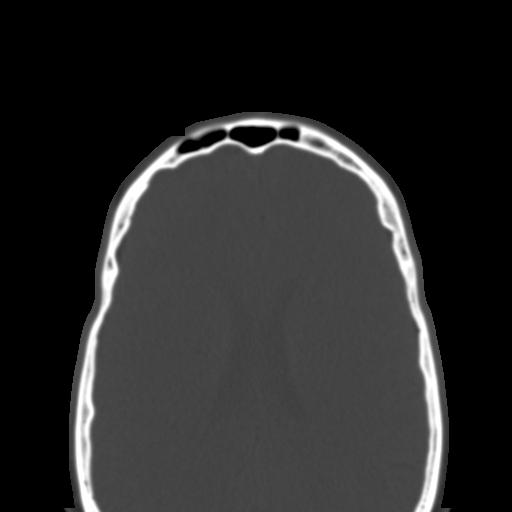
[im 41/43  bone]
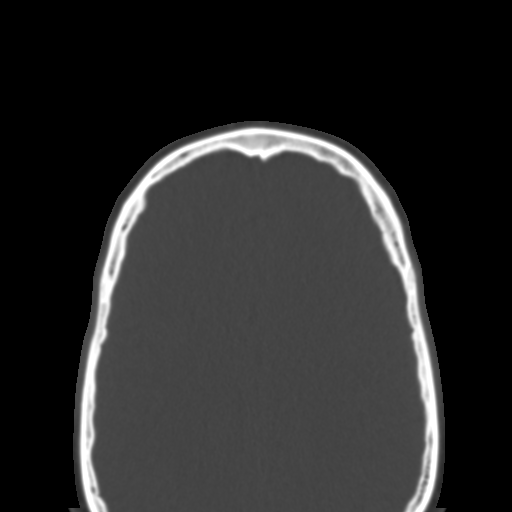

[15 of 30 positions shown; findings below may reference images not displayed]

FINDINGS: Paranasal sinuses:

Frontal: Normally aerated. Soft tissue effaces RIGHT frontal recess.

Ethmoid: Moderate RIGHT anterior posterior ethmoid mucosal
thickening, LEFT is well aerated.

Maxillary: Mild RIGHT maxillary sinus mucosal thickening, 15 mm
mucosal retention cyst. LEFT maxillary sinus is well aerated. No
antral expansion, atresia or bony remodeling.

Sphenoid: Trace RIGHT sphenoid mucosal thickening, LEFT is
well-aerated. Patent sphenoethmoidal recesses.

Right ostiomeatal unit: Soft tissue effacement.

Left ostiomeatal unit: Patent.

Nasal passages: Patent. Intact nasal septum is midline.

Anatomy: LEFT fovea ethmoidalis is shallower than the RIGHT. Conchal
sphenoid pneumatization pattern. Carotid canals protrude into the
posterior sphenoid sinuses with ample bony covering. Shallow
pneumatization medial RIGHT anterior clinoid.

Other: Orbits and intracranial compartment are unremarkable. Visible
mastoid air cells are normally aerated.
IMPRESSION: RIGHT paranasal sinusitis. Obstructed RIGHT frontal drainage pathway
and RIGHT ostiomeatal unit.

## 2018-04-09 ENCOUNTER — Telehealth (HOSPITAL_COMMUNITY): Payer: Self-pay | Admitting: Emergency Medicine

## 2018-04-09 MED ORDER — ONDANSETRON 4 MG PO TBDP
4.0000 mg | ORAL_TABLET | Freq: Three times a day (TID) | ORAL | 0 refills | Status: DC | PRN
Start: 2018-04-09 — End: 2019-07-26

## 2018-04-09 NOTE — Telephone Encounter (Signed)
Per [place] PA, send in zofran to perferred pharmacy.

## 2018-06-01 ENCOUNTER — Encounter: Payer: 59 | Admitting: Family Medicine

## 2018-06-02 ENCOUNTER — Encounter: Payer: 59 | Admitting: Family Medicine

## 2019-02-20 ENCOUNTER — Encounter (INDEPENDENT_AMBULATORY_CARE_PROVIDER_SITE_OTHER): Payer: Self-pay

## 2019-07-20 ENCOUNTER — Telehealth (HOSPITAL_COMMUNITY): Payer: Self-pay | Admitting: Family Medicine

## 2019-07-20 MED ORDER — SUCRALFATE 1 G PO TABS: 1.0000 g | ORAL_TABLET | Freq: Three times a day (TID) | ORAL | 2 refills | Status: DC

## 2019-07-20 MED FILL — SUCRALFATE 1 GM TABLET: 1 | 7 days supply | Qty: 28 | Fill #0

## 2019-07-20 NOTE — Telephone Encounter (Signed)
Patient's wife reports increased gastritis issues. Has appt soon to see [facility]. Has used carafate in the past but current Rx is 74 y old. Requests refill. Sent to pharmacy.

## 2019-07-26 ENCOUNTER — Encounter: Payer: Self-pay | Admitting: Gastroenterology

## 2019-07-26 ENCOUNTER — Ambulatory Visit: Payer: 59 | Admitting: Gastroenterology

## 2019-07-26 VITALS — BP 118/76 | HR 70 | Ht 74.0 in | Wt 227.0 lb

## 2019-07-26 DIAGNOSIS — K2100 Gastro-esophageal reflux disease with esophagitis, without bleeding: Secondary | ICD-10-CM | POA: Diagnosis not present

## 2019-07-26 DIAGNOSIS — R1011 Right upper quadrant pain: Secondary | ICD-10-CM | POA: Insufficient documentation

## 2019-07-26 MED ORDER — FAMOTIDINE 40 MG PO TABS: 40.0000 mg | ORAL_TABLET | Freq: Two times a day (BID) | ORAL | 2 refills | Status: AC

## 2019-07-26 NOTE — Patient Instructions (Signed)
If you are age 42 or older, your body mass index should be between 23-30. Your Body mass index is 29.15 kg/m. If this is out of the aforementioned range listed, please consider follow up with your Primary Care Provider.  If you are age 3 or younger, your body mass index should be between 19-25. Your Body mass index is 29.15 kg/m. If this is out of the aformentioned range listed, please consider follow up with your Primary Care Provider.  _____________________________________________________________________________________________ Dennis Marti have been scheduled for a HIDA scan at [facility] (1st floor) on 08/09/2019. Please arrive 15 minutes prior to your scheduled appointment at  [id number] am. Make certain not to have anything to eat or drink at least 8 hours prior to your test. Should this appointment date or time not work well for you, please call radiology scheduling at [phone].  _____________________________________________________________________ hepatobiliary (HIDA) scan is an imaging procedure used to diagnose problems in the liver, gallbladder and bile ducts. In the HIDA scan, a radioactive chemical or tracer is injected into a vein in your arm. The tracer is handled by the liver like bile. Bile is a fluid produced and excreted by your liver that helps your digestive system break down fats in the foods you eat. Bile is stored in your gallbladder and the gallbladder releases the bile when you eat a meal. A special nuclear medicine scanner (gamma camera) tracks the flow of the tracer from your liver into your gallbladder and small intestine.  During your HIDA scan  You'll be asked to change into a hospital gown before your HIDA scan begins. Your health care team will position you on a table, usually on your back. The radioactive tracer is then injected into a vein in your arm.The tracer travels through your bloodstream to your liver, where it's taken up by the bile-producing cells. The  radioactive tracer travels with the bile from your liver into your gallbladder and through your bile ducts to your small intestine.You may feel some pressure while the radioactive tracer is injected into your vein. As you lie on the table, a special gamma camera is positioned over your abdomen taking pictures of the tracer as it moves through your body. The gamma camera takes pictures continually for about an hour. You'll need to keep still during the HIDA scan. This can become uncomfortable, but you may find that you can lessen the discomfort by taking deep breaths and thinking about other things. Tell your health care team if you're uncomfortable. The radiologist will watch on a computer the progress of the radioactive tracer through your body. The HIDA scan may be stopped when the radioactive tracer is seen in the gallbladder and enters your small intestine. This typically takes about an hour. In some cases extra imaging will be performed if original images aren't satisfactory, if morphine is given to help visualize the gallbladder or if the medication CCK is given to look at the contraction of the gallbladder. This test typically takes 2 hours to complete. ________________________________________________________________________  We have sent the following medications to your pharmacy for you to pick up at your convenience: Pepcid 40 mg twice a day  ________________________________________________________________________________________  Due to recent changes in healthcare laws, you may see the results of your imaging and laboratory studies on MyChart before your provider has had a chance to review them.  We understand that in some cases there may be results that are confusing or concerning to you. Not all laboratory results come back in the  same time frame and the provider may be waiting for multiple results in order to interpret others.  Please give [place] 48 hours in order for your provider to thoroughly  review all the results before contacting the office for clarification of your results.

## 2019-07-26 NOTE — Progress Notes (Signed)
07/26/2019 Matthew Hansen [id number] 11-Oct-1975   HISTORY OF PRESENT ILLNESS: This is a pleasant 44 year old male who is a patient of Dr. Celesta Aver.  He has been seen here in the past with acid reflux related issues, dyspepsia.  He had an EGD in July 2018 which showed LA grade D esophagitis.  He has been fairly intolerant to PPIs.  Says that they cause him to feel swimmy headed, etc.  Currently on Pepcid 20 mg twice daily, which he has been taking for quite some time.  He presents here today with complaints of right upper quadrant abdominal pain.  He reports that this is a burning type pain, but also described as cramping at times as well.  He says that it goes straight through to the right side of his back.  This burning type pain has only been present for the past 2 weeks.  He has experienced issues with right upper quadrant abdominal pain in the past and there was previous discussion of HIDA scan to rule out biliary dysfunction.  Along with EGD in 2018 he also had an ultrasound and a CT scan.  He tells me that this pain begins anywhere from 10 minutes to 30 minutes or so after eating.  He also describes a sore throat, but wonders if that could potentially be allergy related.  He recently started some Carafate 4 times a day, which he says helped with his acid related burning pains in the past.  He has been taking that for about a week and so far has not made a difference in his symptoms this time.  He says that from what he can recall in the past his reflux related burning was more central and this pain has been more in the right upper quadrant.    Past Medical History:  Diagnosis Date  . Anxiety   . Gastritis   . GERD (gastroesophageal reflux disease)   . Umbilical hernia    Past Surgical History:  Procedure Laterality Date  . ESOPHAGOGASTRODUODENOSCOPY  10/2016  . TOTAL HIP ARTHROPLASTY  03/27/2016    reports that he has never smoked. His smokeless tobacco use includes snuff. He reports  current alcohol use. He reports that he does not use drugs. family history includes COPD in his mother; Colon cancer in his paternal grandfather; Hypertension in his father and mother. Allergies  Allergen Reactions  . Omeprazole     Feels dizzy  . Meloxicam Other (See Comments)    Metal taste in mouth  . Naproxen Other (See Comments)    Dizziness      Outpatient Encounter Medications as of 07/26/2019  Medication Sig  . cetirizine (ZYRTEC) 10 MG tablet Take 10 mg by mouth daily.  . famotidine (PEPCID) 20 MG tablet Take 20 mg by mouth 2 (two) times daily.  . fluticasone (FLONASE) 50 MCG/ACT nasal spray Place 2 sprays into both nostrils daily.  . Multiple Vitamin (MULTIVITAMIN) tablet Take 1 tablet by mouth daily.  . Probiotic Product (PROBIOTIC-10) CAPS Take by mouth.  . sucralfate (CARAFATE) 1 g tablet Take 1 g by mouth 4 (four) times daily -  with meals and at bedtime.  . [DISCONTINUED] amoxicillin-clavulanate (AUGMENTIN) 875-125 MG tablet Take 1 tablet by mouth 2 (two) times daily.  . [DISCONTINUED] Omega-3 Fatty Acids (FISH OIL PO) Take by mouth.  . [DISCONTINUED] ondansetron (ZOFRAN ODT) 4 MG disintegrating tablet Take 1 tablet (4 mg total) by mouth every 8 (eight) hours as needed for nausea or vomiting.  . [  DISCONTINUED] OVER THE COUNTER MEDICATION FDgard, take 2 capsules before meals.  . [DISCONTINUED] ranitidine (ZANTAC) 150 MG tablet Take 150 mg by mouth 2 (two) times daily.  . [DISCONTINUED] sucralfate (CARAFATE) 1 g tablet Take 1 tablet (1 g total) by mouth 4 (four) times daily -  with meals and at bedtime.   No facility-administered encounter medications on file as of 07/26/2019.     REVIEW OF SYSTEMS  : All other systems reviewed and negative except where noted in the History of Present Illness.   PHYSICAL EXAM: BP 118/76   Pulse 70   Ht 6\' 2"  (1.88 m)   Wt 227 lb (103 kg)   BMI 29.15 kg/m  General: Well developed white male in no acute distress Head: Normocephalic  and atraumatic Eyes  Sclerae anicteric, conjunctiva pink. Ears: Normal auditory acuity Lungs: Clear throughout to auscultation; no increased WOB. Heart: Regular rate and rhythm; no M/R/G. Abdomen: Soft, non-distended.  BS present.  Non-tender. Musculoskeletal: Symmetrical with no gross deformities  Skin: No lesions on visible extremities Extremities: No edema  Neurological: Alert oriented x 4, grossly non-focal Psychological:  Alert and cooperative. Normal mood and affect  ASSESSMENT AND PLAN: *RUQ abdominal pain:  Describes a burning type pain, but also cramping pain as well that goes directly through to his back on that side.  Has reflux and esophagitis related to that as documented on EGD in 2018.  Intolerant to PPIs for the most part.  Currently on Pepcid 20 mg twice daily and recently started Carafate 4 times a day.  No improvement thus far.  Was discussed about HIDA scan in the past to rule out biliary dysfunction.  We can proceed with that at this time.  In the interim I would like him to increase his Pepcid to 40 mg twice daily.  Prescription will be sent.  He can continue his Carafate 4 times a day as well.   CC:  [name], FNP

## 2019-07-28 ENCOUNTER — Other Ambulatory Visit: Payer: Self-pay

## 2019-07-28 MED ORDER — SUCRALFATE 1 G PO TABS: 1.0000 g | ORAL_TABLET | Freq: Three times a day (TID) | ORAL | 0 refills | Status: AC

## 2019-07-29 ENCOUNTER — Ambulatory Visit: Payer: 59 | Admitting: Gastroenterology

## 2019-08-09 ENCOUNTER — Encounter (HOSPITAL_COMMUNITY): Payer: 59

## 2024-03-01 ENCOUNTER — Encounter: Payer: Self-pay | Admitting: Emergency Medicine

## 2024-03-01 ENCOUNTER — Ambulatory Visit
Admission: EM | Admit: 2024-03-01 | Discharge: 2024-03-01 | Disposition: A | Discharge status: Home / Self Care | Attending: Family Medicine | Admitting: Family Medicine

## 2024-03-01 DIAGNOSIS — M25512 Pain in left shoulder: Secondary | ICD-10-CM

## 2024-03-01 DIAGNOSIS — M7582 Other shoulder lesions, left shoulder: Secondary | ICD-10-CM | POA: Diagnosis not present

## 2024-03-01 NOTE — ED Provider Notes (Signed)
 [facility]    CSN: [id number] Arrival date & time: 03/01/24  1542      History   Chief Complaint Chief Complaint  Patient presents with   Shoulder Pain    HPI Matthew Hansen is a 48 y.o. male.   Patient has a history of rotator cuff problems in his right arm many years ago.  He has now developed some pain in his left arm.  He has pain with movement.  Pain at night.  He has not had any trauma accident or injury.  He has had a cortisone shot in his hip years ago.  Has had hip surgery.  Has not had a steroid shot in his shoulder.  Is here requesting a steroid shot to his left arm    Past Medical History:  Diagnosis Date   Anxiety    Gastritis    GERD (gastroesophageal reflux disease)    Umbilical hernia     Patient Active Problem List   Diagnosis Date Noted   RUQ pain 07/26/2019   Gastroesophageal reflux disease with esophagitis without hemorrhage 07/26/2019   S/P total hip resurfacing 04/09/2016   LVH (left ventricular hypertrophy) 02/26/2016   Primary osteoarthritis of one hip 07/28/2015   KNEE PAIN, LEFT, CHRONIC 08/29/2008   PATELLAR TENDINITIS 08/29/2008    Past Surgical History:  Procedure Laterality Date   ESOPHAGOGASTRODUODENOSCOPY  10/2016   TOTAL HIP ARTHROPLASTY  03/27/2016       Home Medications    Prior to Admission medications   Medication Sig Start Date End Date Taking? Authorizing Provider  cetirizine (ZYRTEC) 10 MG tablet Take 10 mg by mouth daily.    [provider]  famotidine  (PEPCID ) 40 MG tablet Take 1 tablet (40 mg total) by mouth 2 (two) times daily. 07/26/19   Zehr, Jessica D, PA-C  fluticasone  (FLONASE ) 50 MCG/ACT nasal spray Place 2 sprays into both nostrils daily. 11/01/16   Avram Barnie NOVAK, FNP  Multiple Vitamin (MULTIVITAMIN) tablet Take 1 tablet by mouth daily.    [provider]  Probiotic Product (PROBIOTIC-10) CAPS Take by mouth.    [provider]  sucralfate  (CARAFATE ) 1 g tablet Take 1  tablet (1 g total) by mouth 4 (four) times daily -  with meals and at bedtime. 07/28/19 08/27/19  Zehr, Harlene BIRCH, PA-C    Family History Family History  Problem Relation Age of Onset   Colon cancer Paternal Grandfather    Hypertension Mother    COPD Mother    Hypertension Father    Stomach cancer Neg Hx    Esophageal cancer Neg Hx    Pancreatic cancer Neg Hx     Social History Social History   Tobacco Use   Smoking status: Never   Smokeless tobacco: Current    Types: Snuff  Vaping Use   Vaping status: Never Used  Substance Use Topics   Alcohol use: Yes    Comment: 2-3 per week   Drug use: No     Allergies   Omeprazole, Meloxicam, and Naproxen   Review of Systems Review of Systems See HPI  Physical Exam Triage Vital Signs ED Triage Vitals  Encounter Vitals Group     BP 03/01/24 1623 (!) 168/100     Girls Systolic BP Percentile --      Girls Diastolic BP Percentile --      Boys Systolic BP Percentile --      Boys Diastolic BP Percentile --      Pulse Rate 03/01/24  1623 87     Resp 03/01/24 1623 18     Temp 03/01/24 1623 98.3 F (36.8 C)     Temp Source 03/01/24 1623 Oral     SpO2 03/01/24 1623 97 %     Weight --      Height --      Head Circumference --      Peak Flow --      Pain Score 03/01/24 1621 5     Pain Loc --      Pain Education --      Exclude from Growth Chart --    No data found.  Updated Vital Signs BP (!) 168/100 (BP Location: Left Arm)   Pulse 87   Temp 98.3 F (36.8 C) (Oral)   Resp 18   SpO2 97%       Physical Exam Constitutional:      General: He is not in acute distress.    Appearance: He is well-developed.  HENT:     Head: Normocephalic and atraumatic.  Eyes:     Conjunctiva/sclera: Conjunctivae normal.     Pupils: Pupils are equal, round, and reactive to light.  Cardiovascular:     Rate and Rhythm: Normal rate.  Pulmonary:     Effort: Pulmonary effort is normal. No respiratory distress.  Musculoskeletal:         General: Tenderness present. Normal range of motion.     Cervical back: Normal range of motion.     Comments: Left shoulder has limited external more than internal rotation with tenderness about the shoulder joint.  No weakness  Skin:    General: Skin is warm and dry.  Neurological:     Mental Status: He is alert.      UC Treatments / Results  Labs (all labs ordered are listed, but only abnormal results are displayed) Labs Reviewed - No data to display  EKG   Radiology No results found.  Procedures Join Aspiration/Injection  Date/Time: 03/01/2024 5:45 PM  Performed by: Maranda Jamee Jacob, MD Authorized by: Maranda Jamee Jacob, MD   Consent:    Consent obtained:  Verbal   Consent given by:  Patient   Risks, benefits, and alternatives were discussed: yes     Alternatives discussed:  Alternative treatment Universal protocol:    Patient identity confirmed:  Verbally with patient Location:    Location:  Shoulder   Shoulder:  L subacromial bursa Anesthesia:    Anesthesia method:  Local infiltration   Local anesthetic:  Lidocaine 1% w/o epi Procedure details:    Needle gauge:  22 G   Ultrasound guidance: no     Approach:  Posterior   Aspirate amount:  0   Steroid injected: yes   Post-procedure details:    Dressing:  Adhesive bandage Comments:     Patient was injected with 2 cc lidocaine and 2.  Cc bupivacaine with 40 mg of Depo-Medrol without difficulty  (including critical care time)  Medications Ordered in UC Medications - No data to display  Initial Impression / Assessment and Plan / UC Course  I have reviewed the triage vital signs and the nursing notes.  Pertinent labs & imaging results that were available during my care of the patient were reviewed by me and considered in my medical decision making (see chart for details).     Postinjection care reviewed Final Clinical Impressions(s) / UC Diagnoses   Final diagnoses:  Acute pain of left shoulder   Tendinitis of left shoulder  Discharge Instructions      ICE to shoulder for 20 min every few hours Limit heavy use a few days Call for problems   ED Prescriptions   None    PDMP not reviewed this encounter.   Maranda Jamee Jacob, MD 03/01/24 [phone]

## 2024-03-01 NOTE — ED Triage Notes (Signed)
 Pt presents to UC with c/o left shoulder pain x 3/4 days. States woke up one morning and left shoulder was hurting. Reports exercising a lot and playing golf. Certain movements makes the pain worse. Has tried Advil and Voltaren with some relief

## 2024-03-01 NOTE — Discharge Instructions (Signed)
 ICE to shoulder for 20 min every few hours Limit heavy use a few days Call for problems
